# Patient Record
Sex: Female | Born: 1975 | Race: Black or African American | Hispanic: No | Marital: Single | Smoking: Current some day smoker
Health system: Southern US, Community
[De-identification: ages and names within clinical notes are randomized; demographics above are authoritative.]

## PROBLEM LIST (undated history)

## (undated) DIAGNOSIS — N809 Endometriosis, unspecified: Secondary | ICD-10-CM

## (undated) DIAGNOSIS — N3090 Cystitis, unspecified without hematuria: Secondary | ICD-10-CM

## (undated) DIAGNOSIS — E282 Polycystic ovarian syndrome: Secondary | ICD-10-CM

---

## 2020-02-17 ENCOUNTER — Emergency Department
Admission: EM | Admit: 2020-02-17 | Discharge: 2020-02-17 | Disposition: A | Discharge status: Home / Self Care | Payer: Federal, State, Local not specified - PPO | Attending: Emergency Medicine | Admitting: Emergency Medicine

## 2020-02-17 ENCOUNTER — Other Ambulatory Visit: Payer: Self-pay

## 2020-02-17 DIAGNOSIS — F17200 Nicotine dependence, unspecified, uncomplicated: Secondary | ICD-10-CM | POA: Insufficient documentation

## 2020-02-17 DIAGNOSIS — Z20822 Contact with and (suspected) exposure to covid-19: Secondary | ICD-10-CM | POA: Diagnosis not present

## 2020-02-17 DIAGNOSIS — R059 Cough, unspecified: Secondary | ICD-10-CM | POA: Diagnosis present

## 2020-02-17 DIAGNOSIS — B349 Viral infection, unspecified: Secondary | ICD-10-CM | POA: Diagnosis not present

## 2020-02-17 NOTE — ED Triage Notes (Signed)
Pt to ed via pov from home, pt recently taking penicillin for a tooth extraction,  Pt states her throat has began to hurt and is having chest heaviness and cough.

## 2020-02-17 NOTE — ED Provider Notes (Signed)
[facility] EMERGENCY DEPARTMENT Provider Note   CSN: [id number] Arrival date & time: 02/17/20  2031     History No chief complaint on file.   Marissa Rocha is a 45 y.o. female presents to the emerge department evaluation of cough, body aches, chills, sore throat.  She said some body aches but no fevers.  She describes similar symptoms a couple of weeks ago but resolved, her most recent episodes of symptoms began 3 days ago.  She has had a known Covid exposure.  Several other family members in the household have the same symptoms.  She describes a dry cough.  No chest pain or shortness of breath.  She is taking over-the-counter cough and cold medication.  No abdominal pain nausea vomiting or diarrhea.  HPI     History reviewed. No pertinent past medical history.  There are no problems to display for this patient.   History reviewed. No pertinent surgical history.   OB History   No obstetric history on file.     No family history on file.  Social History   Tobacco Use  . Smoking status: Current Every Day Smoker    Packs/day: 0.50  . Smokeless tobacco: Never Used  Substance Use Topics  . Alcohol use: Yes  . Drug use: Never    Home Medications Prior to Admission medications   Not on File    Allergies    Patient has no allergy information on record.  Review of Systems   Review of Systems  Constitutional: Positive for chills. Negative for fever.  HENT: Positive for congestion, rhinorrhea and sore throat. Negative for ear discharge, sinus pressure, sinus pain, trouble swallowing and voice change.   Respiratory: Positive for cough. Negative for choking, shortness of breath, wheezing and stridor.   Cardiovascular: Negative for chest pain.  Gastrointestinal: Negative for abdominal pain, diarrhea, nausea and vomiting.  Genitourinary: Negative for dysuria, flank pain and pelvic pain.  Musculoskeletal: Positive for myalgias. Negative for back pain.   Skin: Negative for rash.  Neurological: Negative for dizziness and headaches.    Physical Exam Updated Vital Signs BP 124/83   Pulse 91   Temp 98.6 F (37 C) (Oral)   Resp 18   Ht 5\' 8"  (1.727 m)   Wt 106.6 kg   SpO2 97%   BMI 35.73 kg/m   Physical Exam Vitals reviewed.  Constitutional:      Appearance: She is well-developed and well-nourished.  HENT:     Head: Normocephalic and atraumatic.     Right Ear: External ear normal.     Left Ear: External ear normal.     Mouth/Throat:     Mouth: Mucous membranes are dry.     Pharynx: No oropharyngeal exudate or posterior oropharyngeal erythema.  Eyes:     Conjunctiva/sclera: Conjunctivae normal.  Cardiovascular:     Rate and Rhythm: Normal rate.     Pulses: Normal pulses.     Heart sounds: Normal heart sounds.  Pulmonary:     Effort: Pulmonary effort is normal. No respiratory distress.     Breath sounds: Normal breath sounds. No stridor. No wheezing, rhonchi or rales.  Abdominal:     General: Abdomen is flat. Bowel sounds are normal. There is no distension.     Palpations: Abdomen is soft.     Tenderness: There is no abdominal tenderness. There is no guarding.  Musculoskeletal:        General: Normal range of motion.     Cervical  back: Normal range of motion and neck supple.  Lymphadenopathy:     Cervical: Cervical adenopathy present.  Skin:    General: Skin is warm.     Findings: No rash.  Neurological:     General: No focal deficit present.     Mental Status: She is alert and oriented to person, place, and time. Mental status is at baseline.  Psychiatric:        Mood and Affect: Mood and affect normal.        Behavior: Behavior normal.        Thought Content: Thought content normal.     ED Results / Procedures / Treatments   Labs (all labs ordered are listed, but only abnormal results are displayed) Labs Reviewed  SARS CORONAVIRUS 2 (TAT 6-24 HRS)    EKG None  Radiology No results  found.  Procedures Procedures   Medications Ordered in ED Medications - No data to display  ED Course  I have reviewed the triage vital signs and the nursing notes.  Pertinent labs & imaging results that were available during my care of the patient were reviewed by me and considered in my medical decision making (see chart for details).    MDM Rules/Calculators/A&P                          45 year old female with viral symptoms x2 to 3 days.  She is fully vaccinated.  She has had positive exposure to Covid.  There-several other family members in the household with same symptoms.  Her symptoms are mild, vital signs are stable.  Physical exam unremarkable, no wheezing rales or rhonchi.  Patient stable and ready for discharge to home.  Covid test pending.  Continue with symptomatic care and over-the-counter cough and cold medications.  She understands signs symptoms return to the ER for. Final Clinical Impression(s) / ED Diagnoses Final diagnoses:  Viral illness  Cough    Rx / DC Orders ED Discharge Orders    None       Ronnette Juniper 02/17/20 2247    Phineas Semen, MD 02/17/20 2317

## 2020-02-17 NOTE — Discharge Instructions (Addendum)
Please continue with over-the-counter cough and cold medication, Tylenol and ibuprofen.  Make sure you are drinking lots of fluids.  If any fevers above 101, shortness of breath return to the emergency department. 

## 2020-02-18 LAB — SARS CORONAVIRUS 2 (TAT 6-24 HRS): SARS Coronavirus 2: NEGATIVE

## 2020-04-27 ENCOUNTER — Emergency Department
Admission: EM | Admit: 2020-04-27 | Discharge: 2020-04-27 | Disposition: A | Discharge status: Home / Self Care | Payer: Federal, State, Local not specified - PPO | Attending: Student in an Organized Health Care Education/Training Program | Admitting: Student in an Organized Health Care Education/Training Program

## 2020-04-27 ENCOUNTER — Other Ambulatory Visit: Payer: Self-pay

## 2020-04-27 ENCOUNTER — Emergency Department: Payer: Federal, State, Local not specified - PPO

## 2020-04-27 DIAGNOSIS — F17200 Nicotine dependence, unspecified, uncomplicated: Secondary | ICD-10-CM | POA: Insufficient documentation

## 2020-04-27 DIAGNOSIS — B349 Viral infection, unspecified: Secondary | ICD-10-CM

## 2020-04-27 DIAGNOSIS — U071 COVID-19: Secondary | ICD-10-CM | POA: Insufficient documentation

## 2020-04-27 DIAGNOSIS — R059 Cough, unspecified: Secondary | ICD-10-CM | POA: Diagnosis present

## 2020-04-27 LAB — RESP PANEL BY RT-PCR (FLU A&B, COVID) ARPGX2
Influenza A by PCR: NEGATIVE
Influenza B by PCR: NEGATIVE
SARS Coronavirus 2 by RT PCR: POSITIVE — AB

## 2020-04-27 NOTE — ED Triage Notes (Signed)
Pt to ER via POV with complaints of cough and congestion that started on Thursday. Pt reports cough was dry but has become productive, clear/ green tinged in color. Denies fevers at home, reports chills. Denies any known covid contacts.

## 2020-04-27 NOTE — ED Provider Notes (Signed)
[facility] Emergency Department Provider Note  ____________________________________________   Event Date/Time   First MD Initiated Contact with Patient 04/27/20 1734     (approximate)  I have reviewed the triage vital signs and the nursing notes.   HISTORY  Chief Complaint Cough   HPI Marissa Rocha is a 45 y.o. female who presents to the emergency department today for evaluation of cough and nasal congestion that started this past Thursday.  Her 2 children are being seen today to with similar symptoms.  Patient reports that her cough was initially dry but has now become productive.  She does report that when child had a positive home COVID test yesterday.  She reports a very intermittent shortness of breath, occasionally when she lays down.  Is not every time.  She denies any history of asthma or other cardiopulmonary disease.  She denies any fever but has had occasional episodes of chills.  He denies any chest pain, headache, nausea vomiting diarrhea or abdominal pain.       History reviewed. No pertinent past medical history.  There are no problems to display for this patient.   History reviewed. No pertinent surgical history.  Prior to Admission medications   Not on File    Allergies Patient has no allergy information on record.  History reviewed. No pertinent family history.  Social History Social History   Tobacco Use  . Smoking status: Current Every Day Smoker    Packs/day: 0.25  . Smokeless tobacco: Never Used  Substance Use Topics  . Alcohol use: Yes  . Drug use: Never    Review of Systems Constitutional: No fever/chills Eyes: No visual changes. ENT: No sore throat. Cardiovascular: Denies chest pain. Respiratory: +cough, +intermittent SOB Gastrointestinal: No abdominal pain.  No nausea, no vomiting.  No diarrhea.  No constipation. Genitourinary: Negative for dysuria. Musculoskeletal: Negative for back pain. Skin: Negative for  rash. Neurological: Negative for headaches, focal weakness or numbness.   ____________________________________________   PHYSICAL EXAM:  VITAL SIGNS: ED Triage Vitals  Enc Vitals Group     BP 04/27/20 1727 (!) 116/101     Pulse Rate 04/27/20 1727 76     Resp 04/27/20 1727 18     Temp 04/27/20 1727 98.5 F (36.9 C)     Temp Source 04/27/20 1727 Oral     SpO2 04/27/20 1727 99 %     Weight --      Height 04/27/20 1734 5\' 8"  (1.727 m)     Head Circumference --      Peak Flow --      Pain Score 04/27/20 1733 5     Pain Loc --      Pain Edu? --      Excl. in GC? --    Constitutional: Alert and oriented. Well appearing and in no acute distress. Eyes: Conjunctivae are normal. PERRL. EOMI. Head: Atraumatic. Nose: No congestion/rhinnorhea. Mouth/Throat: Mucous membranes are moist.  Oropharynx non-erythematous. Neck: No stridor.   Cardiovascular: Normal rate, regular rhythm. Grossly normal heart sounds.  Good peripheral circulation. Respiratory: Normal respiratory effort.  No retractions. Lungs CTAB. Gastrointestinal: Soft and nontender. No distention. No abdominal bruits. No CVA tenderness. Musculoskeletal: No lower extremity tenderness nor edema.  No joint effusions. Neurologic:  Normal speech and language. No gross focal neurologic deficits are appreciated. No gait instability. Skin:  Skin is warm, dry and intact. No rash noted. Psychiatric: Mood and affect are normal. Speech and behavior are normal.  ____________________________________________  LABS (all labs ordered are listed, but only abnormal results are displayed)  Labs Reviewed  RESP PANEL BY RT-PCR (FLU A&B, COVID) ARPGX2 - Abnormal; Notable for the following components:      Result Value   SARS Coronavirus 2 by RT PCR POSITIVE (*)    All other components within normal limits    ____________________________________________  RADIOLOGY I, Lucy Chris, personally viewed and evaluated these images (plain  radiographs) as part of my medical decision making, as well as reviewing the written report by the radiologist.  ED provider interpretation: Chest x-ray is clear  Official radiology report(s): DG Chest 2 View  Result Date: 04/27/2020 CLINICAL DATA:  Cough and shortness of breath EXAM: CHEST - 2 VIEW COMPARISON:  None. FINDINGS: The heart size and mediastinal contours are within normal limits. No focal consolidation. No pleural effusion. No pneumothorax. The visualized skeletal structures are unremarkable. IMPRESSION: No active cardiopulmonary disease. Electronically Signed   By: Maudry Mayhew MD   On: 04/27/2020 18:57   ____________________________________________   INITIAL IMPRESSION / ASSESSMENT AND PLAN / ED COURSE  As part of my medical decision making, I reviewed the following data within the electronic MEDICAL RECORD NUMBER Nursing notes reviewed and incorporated, Radiograph reviewed and Notes from prior ED visits        Patient is a 45 year old female who presents to the emergency department today for evaluation of cough, intermittent shortness of breath that started this past Thursday.  Child in the home tested positive with a home COVID test.  No other known COVID contacts.  In triage, she has reassuring vital signs.  Physical exam is also very reassuring.  Given her complaint of shortness of breath, chest x-ray was obtained and is negative for acute findings.  Recommended supportive care with over-the-counter medications for discomfort and cough.  Return precautions were discussed to return if she has any worsening of her symptoms.  Respiratory panel will be obtained and we will contact the patient of her findings.  Patient is amenable to this plan.  She stable at time for outpatient management.      ____________________________________________   FINAL CLINICAL IMPRESSION(S) / ED DIAGNOSES  Final diagnoses:  Viral illness     ED Discharge Orders    None      *Please note:   Marissa Rocha was evaluated in Emergency Department on 04/28/2020 for the symptoms described in the history of present illness. She was evaluated in the context of the global COVID-19 pandemic, which necessitated consideration that the patient might be at risk for infection with the SARS-CoV-2 virus that causes COVID-19. Institutional protocols and algorithms that pertain to the evaluation of patients at risk for COVID-19 are in a state of rapid change based on information released by regulatory bodies including the CDC and federal and state organizations. These policies and algorithms were followed during the patient's care in the ED.  Some ED evaluations and interventions may be delayed as a result of limited staffing during and the pandemic.*   Note:  This document was prepared using Dragon voice recognition software and may include unintentional dictation errors.   Lucy Chris, PA 04/28/20 Leanord Hawking    Willy Eddy, MD 04/29/20 [phone]

## 2020-04-27 NOTE — Discharge Instructions (Signed)
Please continue to use over the counter supportive measures for your flu-like illness. I will call with the results of your Covid/Flu test. Return to the Emergency Department if you experience any worsening of symptoms, particularly any shortness of breath. Otherwise, follow up with primary care.

## 2020-09-12 ENCOUNTER — Other Ambulatory Visit: Payer: Self-pay

## 2020-09-12 DIAGNOSIS — R079 Chest pain, unspecified: Secondary | ICD-10-CM | POA: Insufficient documentation

## 2020-09-12 DIAGNOSIS — Z5321 Procedure and treatment not carried out due to patient leaving prior to being seen by health care provider: Secondary | ICD-10-CM | POA: Diagnosis not present

## 2020-09-12 DIAGNOSIS — R1030 Lower abdominal pain, unspecified: Secondary | ICD-10-CM | POA: Diagnosis not present

## 2020-09-12 LAB — CBC
HCT: 40.5 % (ref 36.0–46.0)
Hemoglobin: 14.2 g/dL (ref 12.0–15.0)
MCH: 32.6 pg (ref 26.0–34.0)
MCHC: 35.1 g/dL (ref 30.0–36.0)
MCV: 92.9 fL (ref 80.0–100.0)
Platelets: 329 10*3/uL (ref 150–400)
RBC: 4.36 MIL/uL (ref 3.87–5.11)
RDW: 13.3 % (ref 11.5–15.5)
WBC: 14.4 10*3/uL — ABNORMAL HIGH (ref 4.0–10.5)
nRBC: 0 % (ref 0.0–0.2)

## 2020-09-12 LAB — COMPREHENSIVE METABOLIC PANEL
ALT: 23 U/L (ref 0–44)
AST: 36 U/L (ref 15–41)
Albumin: 3.9 g/dL (ref 3.5–5.0)
Alkaline Phosphatase: 91 U/L (ref 38–126)
Anion gap: 8 (ref 5–15)
BUN: 14 mg/dL (ref 6–20)
CO2: 26 mmol/L (ref 22–32)
Calcium: 8.8 mg/dL — ABNORMAL LOW (ref 8.9–10.3)
Chloride: 102 mmol/L (ref 98–111)
Creatinine, Ser: 0.71 mg/dL (ref 0.44–1.00)
GFR, Estimated: >60 mL/min (ref >60)
Glucose, Bld: 90 mg/dL (ref 70–99)
Potassium: 3.8 mmol/L (ref 3.5–5.1)
Sodium: 136 mmol/L (ref 135–145)
Total Bilirubin: 0.6 mg/dL (ref 0.3–1.2)
Total Protein: 7.2 g/dL (ref 6.5–8.1)

## 2020-09-12 LAB — TROPONIN I (HIGH SENSITIVITY): Troponin I (High Sensitivity): 5 ng/L (ref <18)

## 2020-09-12 NOTE — ED Triage Notes (Signed)
Pt in with co lower abd pain that started 2 weeks ago pt has hx of endometriosis. Does have an apptm with pmd but states came for pain control. Pt states she also started having chest pain today with radiation to left arm. Denies any cardiac issues.

## 2020-09-13 ENCOUNTER — Emergency Department
Admission: EM | Admit: 2020-09-13 | Discharge: 2020-09-13 | Disposition: A | Discharge status: Home / Self Care | Payer: Federal, State, Local not specified - PPO | Attending: Emergency Medicine | Admitting: Emergency Medicine

## 2020-09-17 ENCOUNTER — Emergency Department
Admission: EM | Admit: 2020-09-17 | Discharge: 2020-09-17 | Disposition: A | Discharge status: Home / Self Care | Payer: Federal, State, Local not specified - PPO | Attending: Emergency Medicine | Admitting: Emergency Medicine

## 2020-09-17 ENCOUNTER — Other Ambulatory Visit: Payer: Self-pay

## 2020-09-17 DIAGNOSIS — X58XXXA Exposure to other specified factors, initial encounter: Secondary | ICD-10-CM | POA: Insufficient documentation

## 2020-09-17 DIAGNOSIS — Z5321 Procedure and treatment not carried out due to patient leaving prior to being seen by health care provider: Secondary | ICD-10-CM | POA: Insufficient documentation

## 2020-09-17 DIAGNOSIS — S6992XA Unspecified injury of left wrist, hand and finger(s), initial encounter: Secondary | ICD-10-CM | POA: Diagnosis present

## 2020-09-17 DIAGNOSIS — S61012A Laceration without foreign body of left thumb without damage to nail, initial encounter: Secondary | ICD-10-CM | POA: Insufficient documentation
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
## 2020-10-30 ENCOUNTER — Other Ambulatory Visit: Payer: Self-pay

## 2020-10-30 ENCOUNTER — Encounter: Payer: Self-pay | Admitting: Emergency Medicine

## 2020-10-30 ENCOUNTER — Emergency Department: Payer: Federal, State, Local not specified - PPO

## 2020-10-30 ENCOUNTER — Emergency Department
Admission: EM | Admit: 2020-10-30 | Discharge: 2020-10-30 | Disposition: A | Discharge status: Home / Self Care | Payer: Federal, State, Local not specified - PPO | Attending: Emergency Medicine | Admitting: Emergency Medicine

## 2020-10-30 DIAGNOSIS — F17210 Nicotine dependence, cigarettes, uncomplicated: Secondary | ICD-10-CM | POA: Insufficient documentation

## 2020-10-30 DIAGNOSIS — R410 Disorientation, unspecified: Secondary | ICD-10-CM | POA: Diagnosis not present

## 2020-10-30 DIAGNOSIS — R42 Dizziness and giddiness: Secondary | ICD-10-CM | POA: Insufficient documentation

## 2020-10-30 DIAGNOSIS — Y92410 Unspecified street and highway as the place of occurrence of the external cause: Secondary | ICD-10-CM | POA: Insufficient documentation

## 2020-10-30 DIAGNOSIS — S060X0A Concussion without loss of consciousness, initial encounter: Secondary | ICD-10-CM | POA: Diagnosis not present

## 2020-10-30 DIAGNOSIS — S0990XA Unspecified injury of head, initial encounter: Secondary | ICD-10-CM | POA: Diagnosis present

## 2020-10-30 DIAGNOSIS — K115 Sialolithiasis: Secondary | ICD-10-CM | POA: Insufficient documentation

## 2020-10-30 LAB — COMPREHENSIVE METABOLIC PANEL
ALT: 30 U/L (ref 0–44)
AST: 62 U/L — ABNORMAL HIGH (ref 15–41)
Albumin: 3.8 g/dL (ref 3.5–5.0)
Alkaline Phosphatase: 94 U/L (ref 38–126)
Anion gap: 6 (ref 5–15)
BUN: 13 mg/dL (ref 6–20)
CO2: 27 mmol/L (ref 22–32)
Calcium: 8.8 mg/dL — ABNORMAL LOW (ref 8.9–10.3)
Chloride: 104 mmol/L (ref 98–111)
Creatinine, Ser: 0.77 mg/dL (ref 0.44–1.00)
GFR, Estimated: >60 mL/min (ref >60)
Glucose, Bld: 91 mg/dL (ref 70–99)
Potassium: 3.6 mmol/L (ref 3.5–5.1)
Sodium: 137 mmol/L (ref 135–145)
Total Bilirubin: 0.8 mg/dL (ref 0.3–1.2)
Total Protein: 7.4 g/dL (ref 6.5–8.1)

## 2020-10-30 LAB — CBC WITH DIFFERENTIAL/PLATELET
Abs Immature Granulocytes: 0.04 10*3/uL (ref 0.00–0.07)
Basophils Absolute: 0.1 10*3/uL (ref 0.0–0.1)
Basophils Relative: 1 %
Eosinophils Absolute: 0.1 10*3/uL (ref 0.0–0.5)
Eosinophils Relative: 1 %
HCT: 40.5 % (ref 36.0–46.0)
Hemoglobin: 14.1 g/dL (ref 12.0–15.0)
Immature Granulocytes: 0 %
Lymphocytes Relative: 24 %
Lymphs Abs: 2.5 10*3/uL (ref 0.7–4.0)
MCH: 32.3 pg (ref 26.0–34.0)
MCHC: 34.8 g/dL (ref 30.0–36.0)
MCV: 92.9 fL (ref 80.0–100.0)
Monocytes Absolute: 0.8 10*3/uL (ref 0.1–1.0)
Monocytes Relative: 8 %
Neutro Abs: 6.6 10*3/uL (ref 1.7–7.7)
Neutrophils Relative %: 66 %
Platelets: 268 10*3/uL (ref 150–400)
RBC: 4.36 MIL/uL (ref 3.87–5.11)
RDW: 13 % (ref 11.5–15.5)
WBC: 10.1 10*3/uL (ref 4.0–10.5)
nRBC: 0 % (ref 0.0–0.2)

## 2020-10-30 MED ORDER — IOHEXOL 300 MG/ML  SOLN
75.0000 mL | Freq: Once | INTRAMUSCULAR | Status: AC | PRN
  Administered 2020-10-30: 75 mL via INTRAVENOUS
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
## 2021-03-27 ENCOUNTER — Other Ambulatory Visit: Payer: Self-pay

## 2021-03-27 DIAGNOSIS — Z5321 Procedure and treatment not carried out due to patient leaving prior to being seen by health care provider: Secondary | ICD-10-CM | POA: Insufficient documentation

## 2021-03-27 DIAGNOSIS — R1030 Lower abdominal pain, unspecified: Secondary | ICD-10-CM | POA: Insufficient documentation

## 2021-03-27 LAB — CBC
HCT: 43.8 % (ref 36.0–46.0)
Hemoglobin: 14.4 g/dL (ref 12.0–15.0)
MCH: 31.2 pg (ref 26.0–34.0)
MCHC: 32.9 g/dL (ref 30.0–36.0)
MCV: 95 fL (ref 80.0–100.0)
Platelets: 336 10*3/uL (ref 150–400)
RBC: 4.61 MIL/uL (ref 3.87–5.11)
RDW: 13.3 % (ref 11.5–15.5)
WBC: 15.9 10*3/uL — ABNORMAL HIGH (ref 4.0–10.5)
nRBC: 0 % (ref 0.0–0.2)

## 2021-03-27 LAB — POC URINE PREG, ED: Preg Test, Ur: NEGATIVE

## 2021-03-27 NOTE — ED Triage Notes (Signed)
Pt presents to ER with c/o lower abd pain that started Thursday but has become worse today.  Pt states she has hx of endometriosis.  Denies vaginal bleeding at this time.  Denies n/v but states she had some diarrhea recently.  Pt is A&O x4 at this time in NAD in triage.   ?

## 2021-03-28 ENCOUNTER — Emergency Department
Admission: EM | Admit: 2021-03-28 | Discharge: 2021-03-28 | Disposition: A | Discharge status: Home / Self Care | Payer: Federal, State, Local not specified - PPO | Attending: Emergency Medicine | Admitting: Emergency Medicine

## 2021-03-28 LAB — COMPREHENSIVE METABOLIC PANEL
ALT: 21 U/L (ref 0–44)
AST: 19 U/L (ref 15–41)
Albumin: 4 g/dL (ref 3.5–5.0)
Alkaline Phosphatase: 84 U/L (ref 38–126)
Anion gap: 5 (ref 5–15)
BUN: 12 mg/dL (ref 6–20)
CO2: 29 mmol/L (ref 22–32)
Calcium: 9 mg/dL (ref 8.9–10.3)
Chloride: 104 mmol/L (ref 98–111)
Creatinine, Ser: 0.74 mg/dL (ref 0.44–1.00)
GFR, Estimated: >60 mL/min (ref >60)
Glucose, Bld: 109 mg/dL — ABNORMAL HIGH (ref 70–99)
Potassium: 3.6 mmol/L (ref 3.5–5.1)
Sodium: 138 mmol/L (ref 135–145)
Total Bilirubin: 0.3 mg/dL (ref 0.3–1.2)
Total Protein: 7.3 g/dL (ref 6.5–8.1)

## 2021-03-28 LAB — URINALYSIS, ROUTINE W REFLEX MICROSCOPIC
Bilirubin Urine: NEGATIVE
Glucose, UA: NEGATIVE mg/dL
Ketones, ur: NEGATIVE mg/dL
Leukocytes,Ua: NEGATIVE
Nitrite: NEGATIVE
Protein, ur: NEGATIVE mg/dL
Specific Gravity, Urine: 1.025 (ref 1.005–1.030)
pH: 5 (ref 5.0–8.0)

## 2021-03-28 LAB — LIPASE, BLOOD: Lipase: 27 U/L (ref 11–51)

## 2021-03-28 NOTE — ED Notes (Signed)
No answer when called several times from lobby 

## 2021-05-30 ENCOUNTER — Emergency Department: Payer: Federal, State, Local not specified - PPO

## 2021-05-30 ENCOUNTER — Encounter: Payer: Self-pay | Admitting: *Deleted

## 2021-05-30 ENCOUNTER — Other Ambulatory Visit: Payer: Self-pay

## 2021-05-30 DIAGNOSIS — R110 Nausea: Secondary | ICD-10-CM | POA: Diagnosis not present

## 2021-05-30 DIAGNOSIS — Z8742 Personal history of other diseases of the female genital tract: Secondary | ICD-10-CM | POA: Insufficient documentation

## 2021-05-30 DIAGNOSIS — R1031 Right lower quadrant pain: Secondary | ICD-10-CM | POA: Insufficient documentation

## 2021-05-30 DIAGNOSIS — R1032 Left lower quadrant pain: Secondary | ICD-10-CM | POA: Diagnosis not present

## 2021-05-30 DIAGNOSIS — R300 Dysuria: Secondary | ICD-10-CM | POA: Diagnosis not present

## 2021-05-30 LAB — BASIC METABOLIC PANEL
Anion gap: 7 (ref 5–15)
BUN: 13 mg/dL (ref 6–20)
CO2: 25 mmol/L (ref 22–32)
Calcium: 8.8 mg/dL — ABNORMAL LOW (ref 8.9–10.3)
Chloride: 104 mmol/L (ref 98–111)
Creatinine, Ser: 0.74 mg/dL (ref 0.44–1.00)
GFR, Estimated: >60 mL/min (ref >60)
Glucose, Bld: 120 mg/dL — ABNORMAL HIGH (ref 70–99)
Potassium: 3.4 mmol/L — ABNORMAL LOW (ref 3.5–5.1)
Sodium: 136 mmol/L (ref 135–145)

## 2021-05-30 LAB — CBC
HCT: 40.7 % (ref 36.0–46.0)
Hemoglobin: 13.7 g/dL (ref 12.0–15.0)
MCH: 31.8 pg (ref 26.0–34.0)
MCHC: 33.7 g/dL (ref 30.0–36.0)
MCV: 94.4 fL (ref 80.0–100.0)
Platelets: 284 10*3/uL (ref 150–400)
RBC: 4.31 MIL/uL (ref 3.87–5.11)
RDW: 13.2 % (ref 11.5–15.5)
WBC: 15 10*3/uL — ABNORMAL HIGH (ref 4.0–10.5)
nRBC: 0 % (ref 0.0–0.2)

## 2021-05-30 LAB — POC URINE PREG, ED: Preg Test, Ur: NEGATIVE

## 2021-05-30 LAB — TROPONIN I (HIGH SENSITIVITY): Troponin I (High Sensitivity): 3 ng/L (ref <18)

## 2021-05-30 NOTE — ED Provider Triage Note (Signed)
Emergency Medicine Provider Triage Evaluation Note  Marissa Rocha , a 46 y.o. female  was evaluated in triage.  Pt complains of lower abdominal pain with pain when bladder gets full x 1 week. Also having intermittent chest pain that started earlier today.  Review of Systems  Positive: Abdominal pain and chest pain Negative: Fever  Physical Exam  There were no vitals taken for this visit. Gen:   Awake, no distress   Resp:  Normal effort  MSK:   Moves extremities without difficulty  Other:    Medical Decision Making  Medically screening exam initiated at 9:18 PM.  Appropriate orders placed.  [name] was informed that the remainder of the evaluation will be completed by another provider, this initial triage assessment does not replace that evaluation, and the importance of remaining in the ED until their evaluation is complete.   Chinita Pester, FNP 05/30/21 2123

## 2021-05-30 NOTE — ED Triage Notes (Signed)
Pt reports low abd pain and chest pain.  Abd pain for 1 week. Pt has nausea.  No vag bleeding.  No urinary sx.  Pt states chest pain began today.  Pain when lying on left side.  Pt alert  speech clear.

## 2021-05-31 ENCOUNTER — Emergency Department: Payer: Federal, State, Local not specified - PPO

## 2021-05-31 ENCOUNTER — Emergency Department
Admission: EM | Admit: 2021-05-31 | Discharge: 2021-05-31 | Disposition: A | Discharge status: Home / Self Care | Payer: Federal, State, Local not specified - PPO | Attending: Emergency Medicine | Admitting: Emergency Medicine

## 2021-05-31 DIAGNOSIS — R1030 Lower abdominal pain, unspecified: Secondary | ICD-10-CM

## 2021-05-31 DIAGNOSIS — Z8742 Personal history of other diseases of the female genital tract: Secondary | ICD-10-CM

## 2021-05-31 LAB — HEPATIC FUNCTION PANEL
ALT: 14 U/L (ref 0–44)
AST: 18 U/L (ref 15–41)
Albumin: 3.6 g/dL (ref 3.5–5.0)
Alkaline Phosphatase: 75 U/L (ref 38–126)
Bilirubin, Direct: <0.1 mg/dL (ref 0.0–0.2)
Total Bilirubin: 0.3 mg/dL (ref 0.3–1.2)
Total Protein: 7.1 g/dL (ref 6.5–8.1)

## 2021-05-31 LAB — URINALYSIS, ROUTINE W REFLEX MICROSCOPIC
Bilirubin Urine: NEGATIVE
Glucose, UA: NEGATIVE mg/dL
Ketones, ur: NEGATIVE mg/dL
Leukocytes,Ua: NEGATIVE
Nitrite: NEGATIVE
Protein, ur: NEGATIVE mg/dL
Specific Gravity, Urine: 1.023 (ref 1.005–1.030)
pH: 5 (ref 5.0–8.0)

## 2021-05-31 LAB — LIPASE, BLOOD: Lipase: 30 U/L (ref 11–51)

## 2021-05-31 LAB — TROPONIN I (HIGH SENSITIVITY): Troponin I (High Sensitivity): 2 ng/L (ref <18)

## 2021-05-31 MED ORDER — MORPHINE SULFATE (PF) 4 MG/ML IV SOLN
4.0000 mg | Freq: Once | INTRAVENOUS | Status: AC
  Administered 2021-05-31: 4 mg via INTRAVENOUS
  Filled 2021-05-31: qty 1

## 2021-05-31 MED ORDER — LACTATED RINGERS IV BOLUS
1000.0000 mL | Freq: Once | INTRAVENOUS | Status: AC
  Administered 2021-05-31: 1000 mL via INTRAVENOUS

## 2021-05-31 MED ORDER — IOHEXOL 300 MG/ML  SOLN
100.0000 mL | Freq: Once | INTRAMUSCULAR | Status: AC | PRN
  Administered 2021-05-31: 100 mL via INTRAVENOUS

## 2021-05-31 MED ORDER — ONDANSETRON HCL 4 MG/2ML IJ SOLN
4.0000 mg | Freq: Once | INTRAMUSCULAR | Status: AC
  Administered 2021-05-31: 4 mg via INTRAVENOUS
  Filled 2021-05-31: qty 2

## 2021-05-31 NOTE — ED Provider Notes (Signed)
[facility] Provider Note    Event Date/Time   First MD Initiated Contact with Patient 05/31/21 0246     (approximate)   History   Chief Complaint Abdominal Pain   HPI  Marissa Rocha is a 46 y.o. female with past medical history of PCOS and endometriosis who presents to the ED complaining of abdominal pain.  Patient reports that she has been dealing with 1 week of gradually worsening pain to the bilateral lower quadrants of her abdomen.  She describes the pain as sharp and constant, not exacerbated or alleviated by anything in particular.  It radiates around to both flanks and she endorses some dysuria, denies hematuria.  She denies any history of kidney stones or similar pain in the past.  Her LMP was approximately 2 months ago, but states her menses are usually irregular due to Mirena.  She has not had any vaginal bleeding or discharge.  She reports feeling nauseous but has not vomited, denies any changes in her bowel movements.  She does report having an episode of pain in the center of her chest just prior to arrival, but has now resolved.     Physical Exam   Triage Vital Signs: ED Triage Vitals  Enc Vitals Group     BP 05/30/21 2121 (!) 159/97     Pulse Rate 05/30/21 2121 90     Resp 05/30/21 2121 18     Temp 05/30/21 2121 98.4 F (36.9 C)     Temp Source 05/30/21 2121 Oral     SpO2 05/30/21 2121 95 %     Weight 05/30/21 2122 215 lb (97.5 kg)     Height 05/30/21 2122 5\' 8"  (1.727 m)     Head Circumference --      Peak Flow --      Pain Score 05/30/21 2122 9     Pain Loc --      Pain Edu? --      Excl. in GC? --     Most recent vital signs: Vitals:   05/31/21 0500 05/31/21 0530  BP: 131/65 115/83  Pulse: 64 (!) 55  Resp: 15 17  Temp:    SpO2: 97% 99%    Constitutional: Alert and oriented. Eyes: Conjunctivae are normal. Head: Atraumatic. Nose: No congestion/rhinnorhea. Mouth/Throat: Mucous membranes are moist.  Cardiovascular: Normal  rate, regular rhythm. Grossly normal heart sounds.  2+ radial pulses bilaterally. Respiratory: Normal respiratory effort.  No retractions. Lungs CTAB. Gastrointestinal: Soft and tender to palpation in the bilateral lower quadrants with no rebound or guarding. No distention. Musculoskeletal: No lower extremity tenderness nor edema.  Neurologic:  Normal speech and language. No gross focal neurologic deficits are appreciated.    ED Results / Procedures / Treatments   Labs (all labs ordered are listed, but only abnormal results are displayed) Labs Reviewed  BASIC METABOLIC PANEL - Abnormal; Notable for the following components:      Result Value   Potassium 3.4 (*)    Glucose, Bld 120 (*)    Calcium 8.8 (*)    All other components within normal limits  CBC - Abnormal; Notable for the following components:   WBC 15.0 (*)    All other components within normal limits  URINALYSIS, ROUTINE W REFLEX MICROSCOPIC - Abnormal; Notable for the following components:   Color, Urine YELLOW (*)    APPearance CLEAR (*)    Hgb urine dipstick MODERATE (*)    Bacteria, UA RARE (*)  All other components within normal limits  HEPATIC FUNCTION PANEL  LIPASE, BLOOD  POC URINE PREG, ED  TROPONIN I (HIGH SENSITIVITY)  TROPONIN I (HIGH SENSITIVITY)     EKG  ED ECG REPORT I, Chesley Noon, the attending physician, personally viewed and interpreted this ECG.   Date: 05/31/2021  EKG Time: 21:24  Rate: 84  Rhythm: normal sinus rhythm  Axis: Normal  Intervals:none  ST&T Change: None  RADIOLOGY Chest x-ray reviewed and interpreted by me with no focal infiltrate, edema, or effusion.  PROCEDURES:  Critical Care performed: No  Procedures   MEDICATIONS ORDERED IN ED: Medications  lactated ringers bolus 1,000 mL (1,000 mLs Intravenous New Bag/Given 05/31/21 0431)  morphine (PF) 4 MG/ML injection 4 mg (4 mg Intravenous Given 05/31/21 0431)  ondansetron (ZOFRAN) injection 4 mg (4 mg Intravenous  Given 05/31/21 0418)  iohexol (OMNIPAQUE) 300 MG/ML solution 100 mL (100 mLs Intravenous Contrast Given 05/31/21 0440)     IMPRESSION / MDM / ASSESSMENT AND PLAN / ED COURSE  I reviewed the triage vital signs and the nursing notes.                              46 y.o. female with past medical history of PCOS and endometriosis who presents to the ED complaining of bilateral lower quadrant abdominal pain radiating up to both flanks for about the past week.  Differential diagnosis includes, but is not limited to, kidney stone, pyelonephritis, ovarian cyst, ovarian torsion, ectopic pregnancy, diverticulitis, appendicitis, cystitis.  Patient well-appearing and in no acute distress, vital signs are unremarkable.  She has tenderness to bilateral lower quadrants on exam with bilateral CVA tenderness, we will further assess with CT scan for ureterolithiasis or other source of patient's pain.  Pregnancy testing is negative and urinalysis is pending.  CBC shows no anemia or leukocytosis, BMP shows no electrolyte abnormality or AKI.  She did have brief episode of chest pain just prior to arrival in the ED that has since resolved.  EKG shows no evidence of arrhythmia or ischemia and 2 sets of troponin are negative.  Chest x-ray is unremarkable and I doubt PE or dissection given resolution of pain.  No apparent emergent pathology causing her chest pain at this time.  Plan to treat symptomatically with IV morphine and Zofran, reassess.  CT scan is unremarkable and patient is feeling much better following symptomatic management.  LFTs and lipase are within normal limits.  Patient is appropriate for discharge home with PCP and OB/GYN follow-up for potential endometriosis.  She was counseled to return to the ED for new or worsening symptoms, patient agrees with plan.      FINAL CLINICAL IMPRESSION(S) / ED DIAGNOSES   Final diagnoses:  Lower abdominal pain  History of endometriosis     Rx / DC Orders   ED  Discharge Orders     None        Note:  This document was prepared using Dragon voice recognition software and may include unintentional dictation errors.   Chesley Noon, MD 05/31/21 [phone]

## 2021-05-31 NOTE — ED Notes (Signed)
ED Provider at bedside. 

## 2021-11-29 ENCOUNTER — Encounter: Payer: Self-pay | Admitting: Emergency Medicine

## 2021-11-29 ENCOUNTER — Emergency Department
Admission: EM | Admit: 2021-11-29 | Discharge: 2021-11-30 | Disposition: A | Discharge status: Home / Self Care | Payer: Federal, State, Local not specified - PPO | Attending: Emergency Medicine | Admitting: Emergency Medicine

## 2021-11-29 ENCOUNTER — Emergency Department: Payer: Federal, State, Local not specified - PPO

## 2021-11-29 DIAGNOSIS — J069 Acute upper respiratory infection, unspecified: Secondary | ICD-10-CM | POA: Insufficient documentation

## 2021-11-29 DIAGNOSIS — R0981 Nasal congestion: Secondary | ICD-10-CM | POA: Diagnosis present

## 2021-11-29 DIAGNOSIS — Z20822 Contact with and (suspected) exposure to covid-19: Secondary | ICD-10-CM | POA: Insufficient documentation

## 2021-11-29 LAB — RESP PANEL BY RT-PCR (FLU A&B, COVID) ARPGX2
Influenza A by PCR: NEGATIVE
Influenza B by PCR: NEGATIVE
SARS Coronavirus 2 by RT PCR: NEGATIVE

## 2021-11-29 LAB — GROUP A STREP BY PCR: Group A Strep by PCR: NOT DETECTED

## 2021-11-29 MED ORDER — IBUPROFEN 400 MG PO TABS
400.0000 mg | ORAL_TABLET | Freq: Once | ORAL | Status: AC
  Administered 2021-11-30: 400 mg via ORAL
  Filled 2021-11-29: qty 1

## 2021-11-29 NOTE — ED Triage Notes (Signed)
Pt presents via POV with complaints of nasal congestion with fatigue and cough for the last 5 days. Pt received OTC cough medication without improvements to their sx. Denies CP, N/V/D, SOB.

## 2021-11-29 NOTE — ED Notes (Signed)
Covid and strep swabs sent to the lab at this time.  

## 2021-11-30 NOTE — ED Provider Notes (Signed)
[facility] Provider Note    Event Date/Time   First MD Initiated Contact with Patient 11/29/21 2334     (approximate)   History   Nasal Congestion   HPI  Marissa Rocha is a 46 y.o. female with past medical history of endometriosis presents with nasal congestion fatigue chest pressure.  Symptoms have been going on for the last 4 days.  Started after Thanksgiving.  She has had significant nasal congestion and cough and feels extremely fatigued.  She is also having some pain in the upper back and chest with coughing.  Denies shortness of breath denies fever.  Both of her sons have similar illnesses.  Her son tested positive for COVID in the ED today.     History reviewed. No pertinent past medical history.  There are no problems to display for this patient.    Physical Exam  Triage Vital Signs: ED Triage Vitals  Enc Vitals Group     BP 11/29/21 2211 119/80     Pulse Rate 11/29/21 2211 85     Resp 11/29/21 2211 20     Temp 11/29/21 2211 98.6 F (37 C)     Temp Source 11/29/21 2211 Oral     SpO2 11/29/21 2211 98 %     Weight 11/29/21 2206 226 lb 3.1 oz (102.6 kg)     Height 11/29/21 2206 5\' 8"  (1.727 m)     Head Circumference --      Peak Flow --      Pain Score 11/29/21 2212 6     Pain Loc --      Pain Edu? --      Excl. in GC? --     Most recent vital signs: Vitals:   11/29/21 2211 11/30/21 0041  BP: 119/80 128/81  Pulse: 85 79  Resp: 20 17  Temp: 98.6 F (37 C) 98.7 F (37.1 C)  SpO2: 98% 99%     General: Awake, no distress.  CV:  Good peripheral perfusion.  No peripheral edema or asymmetry Resp:  Normal effort.  Lungs are clear no increased work of breathing Abd:  No distention.  Abdomen soft and nontender Neuro:             Awake, Alert, Oriented x 3  Other:     ED Results / Procedures / Treatments  Labs (all labs ordered are listed, but only abnormal results are displayed) Labs Reviewed  RESP PANEL BY RT-PCR (FLU A&B,  COVID) [id number]  GROUP A STREP BY PCR     EKG EKG reviewed and interpreted myself shows normal sinus rhythm normal axis normal intervals inverted T wave in V3 similar to prior    RADIOLOGY I reviewed and interpreted the CXR which does not show any acute cardiopulmonary process    PROCEDURES:  Critical Care performed: No  Procedures   MEDICATIONS ORDERED IN ED: Medications  ibuprofen (ADVIL) tablet 400 mg (400 mg Oral Given 11/30/21 0036)     IMPRESSION / MDM / ASSESSMENT AND PLAN / ED COURSE  I reviewed the triage vital signs and the nursing notes.                              Patient's presentation is most consistent with acute, uncomplicated illness.  Differential diagnosis includes, but is not limited to, viral illness, pneumonia, COVID-19, influenza, likely PE ACS  The patient is a 46 year old female presents with both of  her sons because of cough congestion fatigue and chest/back pain.  Symptoms been going on for the last 4 days she endorses significant fatigue and wanting to sleep constantly and nasal congestion postnasal drip.  She is now having some upper back and chest pain that is worse with coughing and breathing.  Patient looks well she is not in any respiratory distress she is satting 99% on room air not tachycardic.  Lungs are clear.  No signs of DVT on exam.  Her son tested positive for COVID today.  Her test is negative but suspect this could be falsely negative given her symptoms.  Strep test was also sent from triage and this is negative.  Chest x-ray is clear.  Performed EKG given the chest pain she has an inverted T wave in V3 but this is similar in appearance to prior EKGs.  Overall my suspicion for life-threatening cause of her symptoms is low suspect viral syndrome.  Discussed supportive measures she is appropriate for discharge       FINAL CLINICAL IMPRESSION(S) / ED DIAGNOSES   Final diagnoses:  Upper respiratory tract infection, unspecified type      Rx / DC Orders   ED Discharge Orders     None        Note:  This document was prepared using Dragon voice recognition software and may include unintentional dictation errors.   Georga Hacking, MD 11/30/21 [phone]

## 2022-01-07 ENCOUNTER — Other Ambulatory Visit: Payer: Self-pay

## 2022-01-07 ENCOUNTER — Emergency Department: Payer: Federal, State, Local not specified - PPO

## 2022-01-07 ENCOUNTER — Encounter: Payer: Self-pay | Admitting: Radiology

## 2022-01-07 ENCOUNTER — Emergency Department
Admission: EM | Admit: 2022-01-07 | Discharge: 2022-01-08 | Disposition: A | Discharge status: Home / Self Care | Payer: Federal, State, Local not specified - PPO | Attending: Emergency Medicine | Admitting: Emergency Medicine

## 2022-01-07 DIAGNOSIS — M545 Low back pain, unspecified: Secondary | ICD-10-CM | POA: Diagnosis present

## 2022-01-07 DIAGNOSIS — X58XXXA Exposure to other specified factors, initial encounter: Secondary | ICD-10-CM | POA: Diagnosis not present

## 2022-01-07 DIAGNOSIS — S39012A Strain of muscle, fascia and tendon of lower back, initial encounter: Secondary | ICD-10-CM | POA: Insufficient documentation

## 2022-01-07 LAB — URINALYSIS, ROUTINE W REFLEX MICROSCOPIC
Bilirubin Urine: NEGATIVE
Glucose, UA: NEGATIVE mg/dL
Hgb urine dipstick: NEGATIVE
Ketones, ur: NEGATIVE mg/dL
Leukocytes,Ua: NEGATIVE
Nitrite: NEGATIVE
Protein, ur: NEGATIVE mg/dL
Specific Gravity, Urine: 1.029 (ref 1.005–1.030)
pH: 5 (ref 5.0–8.0)

## 2022-01-07 LAB — POC URINE PREG, ED: Preg Test, Ur: NEGATIVE

## 2022-01-07 MED ORDER — LIDOCAINE 5 % EX PTCH
1.0000 | MEDICATED_PATCH | Freq: Once | CUTANEOUS | Status: DC
  Administered 2022-01-08: 1 via TRANSDERMAL
  Filled 2022-01-07: qty 1

## 2022-01-07 NOTE — ED Triage Notes (Signed)
Pt to ED for back pain that started around 0300 this morning. She got up off the couch and had a sudden sharp pain that occurred in her lower back and afterwards. She advised it felt like a pulled muscle. She took some aleve around 330 am and went back to bed with heating pad. Pt took more meds around 2pm. Pt denies any urinary complaints, N/V/D. She also took some prescribed baclofen around 6pm as well that helped some. Pt is COAx4 and in no acute distress at this time.

## 2022-01-07 NOTE — Discharge Instructions (Signed)
Your exam and XR are normal at this time. Take the prescription meds as directed. Follow-up with your provider for ongoing symptoms.

## 2022-01-07 NOTE — ED Provider Notes (Signed)
[facility] Emergency Department Provider Note     Event Date/Time   First MD Initiated Contact with Patient 01/07/22 2205     (approximate)   History   Back Pain   HPI  Marissa Rocha is a 47 y.o. female with a noncontributory medical history, presents to the ED from home, with complaints of low back pain.  She reports onset of symptoms at about 3:00 in the morning.  She reports mechanical strain after she got off the couch and felt immediate sharp pain to her lower back.  She does a muscle relaxant, and a heating pad in the interim before presenting to the ED for further evaluation patient denies any bladder or bowel incontinence, foot drop, saddle anesthesia.  She also denies any urinary symptoms, NVD.  She notes some improvement in her symptomology at the time of this evaluation.  Physical Exam   Triage Vital Signs: ED Triage Vitals  Enc Vitals Group     BP 01/07/22 2125 (!) 142/78     Pulse Rate 01/07/22 2125 88     Resp 01/07/22 2125 16     Temp 01/07/22 2125 97.9 F (36.6 C)     Temp Source 01/07/22 2125 Oral     SpO2 01/07/22 2125 94 %     Weight 01/07/22 2122 235 lb (106.6 kg)     Height 01/07/22 2122 5\' 8"  (1.727 m)     Head Circumference --      Peak Flow --      Pain Score 01/07/22 2121 10     Pain Loc --      Pain Edu? --      Excl. in [place]? --     Most recent vital signs: Vitals:   01/07/22 2125 01/08/22 0100  BP: (!) 142/78 119/72  Pulse: 88 80  Resp: 16 16  Temp: 97.9 F (36.6 C)   SpO2: 94% 100%    General Awake, no distress.  CV:  Good peripheral perfusion.  RESP:  Normal effort.  ABD:  No distention.  MSK:  Normal spinal alignment without midline tenderness, spasm, vomiting, or step-off.  Palpation to the right lumbosacral junction. NEURO: Cranial nerves II to XII grossly intact.  Normal DTRs bilaterally.  Some ipsilateral right-sided lumbar sacral strain with seated straight leg raise on the left.  Normal toe  dorsiflexion and foot eversion on exam.   ED Results / Procedures / Treatments   Labs (all labs ordered are listed, but only abnormal results are displayed) Labs Reviewed  URINALYSIS, ROUTINE W REFLEX MICROSCOPIC - Abnormal; Notable for the following components:      Result Value   Color, Urine YELLOW (*)    APPearance HAZY (*)    All other components within normal limits  POC URINE PREG, ED     EKG    RADIOLOGY   DG Lumbar Spine  IMPRESSION: Negative.     PROCEDURES:  Critical Care performed: No  Procedures   MEDICATIONS ORDERED IN ED: Medications  HYDROcodone-acetaminophen (NORCO/VICODIN) 5-325 MG per tablet 1 tablet (1 tablet Oral Given 01/08/22 0101)     IMPRESSION / MDM / ASSESSMENT AND PLAN / ED COURSE  I reviewed the triage vital signs and the nursing notes.                              Differential diagnosis includes, but is not limited to, lumbar strain, lumbar radiculopathy, lumbar  compression fracture, DJD  Patient's presentation is most consistent with acute complicated illness / injury requiring diagnostic workup.  Patient to the ED for evaluation of lumbar sacral strain.  No red flags on exam.  Patient with no acute neuromuscular deficits appreciated.  Further evaluation with plain films do not reveal any acute findings based on my interpretation.  Patient's diagnosis is consistent with lumbar sacral strain. Patient will be discharged home with prescriptions for lidocaine patches and prednisone. Patient is to follow up with her primary provider as needed or otherwise directed. Patient is given ED precautions to return to the ED for any worsening or new symptoms.   FINAL CLINICAL IMPRESSION(S) / ED DIAGNOSES   Final diagnoses:  Strain of lumbar region, initial encounter     Rx / DC Orders   ED Discharge Orders          Ordered    lidocaine (LIDODERM) 5 %  Every 12 hours PRN        01/08/22 0004    predniSONE (DELTASONE) 20 MG tablet   Daily with breakfast        01/08/22 0004    HYDROcodone-acetaminophen (NORCO) 5-325 MG tablet  3 times daily PRN,   Status:  Discontinued        01/08/22 0004             Note:  This document was prepared using Dragon voice recognition software and may include unintentional dictation errors.    Lissa Hoard, PA-C 01/09/22 1701    Jene Every, MD 01/10/22 1051

## 2022-01-08 ENCOUNTER — Telehealth: Payer: Self-pay | Admitting: Emergency Medicine

## 2022-01-08 MED ORDER — HYDROCODONE-ACETAMINOPHEN 5-325 MG PO TABS: 1.0000 | ORAL_TABLET | Freq: Three times a day (TID) | ORAL | 0 refills | Status: DC | PRN

## 2022-01-08 MED ORDER — PREDNISONE 20 MG PO TABS: 40.0000 mg | ORAL_TABLET | Freq: Every day | ORAL | 0 refills | Status: AC

## 2022-01-08 MED ORDER — HYDROCODONE-ACETAMINOPHEN 5-325 MG PO TABS: 1.0000 | ORAL_TABLET | Freq: Three times a day (TID) | ORAL | 0 refills | Status: AC | PRN

## 2022-01-08 MED ORDER — HYDROCODONE-ACETAMINOPHEN 5-325 MG PO TABS
1.0000 | ORAL_TABLET | Freq: Once | ORAL | Status: AC
  Administered 2022-01-08: 1 via ORAL
  Filled 2022-01-08: qty 1

## 2022-01-08 MED ORDER — LIDOCAINE 5 % EX PTCH: 1.0000 | MEDICATED_PATCH | Freq: Two times a day (BID) | CUTANEOUS | 0 refills | Status: AC | PRN

## 2022-01-08 NOTE — Telephone Encounter (Signed)
Reroute Rx to pharmacy; none in stock at prior.

## 2022-01-24 ENCOUNTER — Other Ambulatory Visit: Payer: Self-pay

## 2022-01-24 ENCOUNTER — Emergency Department
Admission: EM | Admit: 2022-01-24 | Discharge: 2022-01-24 | Disposition: A | Discharge status: Home / Self Care | Payer: Federal, State, Local not specified - PPO | Attending: Emergency Medicine | Admitting: Emergency Medicine

## 2022-01-24 ENCOUNTER — Emergency Department: Payer: Federal, State, Local not specified - PPO

## 2022-01-24 DIAGNOSIS — Z1152 Encounter for screening for COVID-19: Secondary | ICD-10-CM | POA: Insufficient documentation

## 2022-01-24 DIAGNOSIS — J101 Influenza due to other identified influenza virus with other respiratory manifestations: Secondary | ICD-10-CM | POA: Diagnosis not present

## 2022-01-24 DIAGNOSIS — M546 Pain in thoracic spine: Secondary | ICD-10-CM | POA: Insufficient documentation

## 2022-01-24 DIAGNOSIS — R509 Fever, unspecified: Secondary | ICD-10-CM | POA: Diagnosis present

## 2022-01-24 LAB — RESP PANEL BY RT-PCR (RSV, FLU A&B, COVID)  RVPGX2
Influenza A by PCR: NEGATIVE
Influenza B by PCR: NEGATIVE
Resp Syncytial Virus by PCR: NEGATIVE
SARS Coronavirus 2 by RT PCR: NEGATIVE

## 2022-01-24 NOTE — ED Provider Triage Note (Signed)
Emergency Medicine Provider Triage Evaluation Note  Marissa Rocha , a 47 y.o. female  was evaluated in triage.  Pt complains of muscle aches, cough, sore throat. Here with sons who have fever/flu like symptoms.   Review of Systems  Positive: Myalgias, sore throat Negative: Abd pain, n/v/d  Physical Exam  There were no vitals taken for this visit. Gen:   Awake, no distress   Resp:  Normal effort  MSK:   Moves extremities without difficulty  Other:    Medical Decision Making  Medically screening exam initiated at 2:58 PM.  Appropriate orders placed.  Glessie Eustice was informed that the remainder of the evaluation will be completed by another provider, this initial triage assessment does not replace that evaluation, and the importance of remaining in the ED until their evaluation is complete.     Marquette Old, PA-C 01/24/22 1500

## 2022-01-24 NOTE — ED Provider Notes (Signed)
[facility] Provider Note  Patient Contact: 5:08 PM (approximate)   History   Back Pain   HPI  Marissa Rocha is a 47 y.o. female presents to the emergency department with upper back pain and headache.  Patient's sons both tested positive for influenza B recently.  Patient also has nasal congestion and generalized malaise at home.  No chest pain, chest tightness or shortness of breath.  Patient has had some low-grade fever at home.      Physical Exam   Triage Vital Signs: ED Triage Vitals  Enc Vitals Group     BP 01/24/22 1459 (!) 145/88     Pulse Rate 01/24/22 1459 92     Resp 01/24/22 1459 18     Temp 01/24/22 1459 98.6 F (37 C)     Temp src --      SpO2 01/24/22 1459 97 %     Weight --      Height --      Head Circumference --      Peak Flow --      Pain Score 01/24/22 1458 8     Pain Loc --      Pain Edu? --      Excl. in [place]? --     Most recent vital signs: Vitals:   01/24/22 1459  BP: (!) 145/88  Pulse: 92  Resp: 18  Temp: 98.6 F (37 C)  SpO2: 97%     Constitutional: Alert and oriented. Patient is lying supine. Eyes: Conjunctivae are normal. PERRL. EOMI. Head: Atraumatic. ENT:      Ears: Tympanic membranes are mildly injected with mild effusion bilaterally.       Nose: No congestion/rhinnorhea.      Mouth/Throat: Mucous membranes are moist. Posterior pharynx is mildly erythematous.  Hematological/Lymphatic/Immunilogical: No cervical lymphadenopathy.  Cardiovascular: Normal rate, regular rhythm. Normal S1 and S2.  Good peripheral circulation. Respiratory: Normal respiratory effort without tachypnea or retractions. Lungs CTAB. Good air entry to the bases with no decreased or absent breath sounds. Gastrointestinal: Bowel sounds 4 quadrants. Soft and nontender to palpation. No guarding or rigidity. No palpable masses. No distention. No CVA tenderness. Musculoskeletal: Full range of motion to all extremities. No gross deformities  appreciated. Neurologic:  Normal speech and language. No gross focal neurologic deficits are appreciated.  Skin:  Skin is warm, dry and intact. No rash noted. Psychiatric: Mood and affect are normal. Speech and behavior are normal. Patient exhibits appropriate insight and judgement.    ED Results / Procedures / Treatments   Labs (all labs ordered are listed, but only abnormal results are displayed) Labs Reviewed  RESP PANEL BY RT-PCR (RSV, FLU A&B, COVID)  [id number]      PROCEDURES:  Critical Care performed: No  Procedures   MEDICATIONS ORDERED IN ED: Medications - No data to display   IMPRESSION / MDM / [place] / ED COURSE  I reviewed the triage vital signs and the nursing notes.                              Assessment and plan:  Influenza B 47 year old female presents to the emergency department with viral URI-like symptoms and some myalgias.  Patient was mildly hypertensive at triage but vital signs reassuring.  Suspect influenza B infection since both sons tested positive for flu B today.  Supportive medications were encouraged at home as patient is outside the therapeutic  window for Tamiflu.  A work note was provided.  All patient questions were answered.   FINAL CLINICAL IMPRESSION(S) / ED DIAGNOSES   Final diagnoses:  Influenza B     Rx / DC Orders   ED Discharge Orders     None        Note:  This document was prepared using Dragon voice recognition software and may include unintentional dictation errors.   Vallarie Mare [place], Hershal Coria 01/24/22 1711    Lavonia Drafts, MD 01/24/22 Carollee Massed

## 2022-01-24 NOTE — Discharge Instructions (Signed)
You can continue to take naproxen and ibuprofen for headaches and bodyaches. You can continue to take your baclofen at night. Please rest and stay hydrated at home.

## 2022-01-24 NOTE — ED Triage Notes (Signed)
Pt comes with c/o back pain in left shoulder. Pt states headache as well. Pt states this all started few weeks ago.

## 2022-01-28 ENCOUNTER — Emergency Department: Payer: Federal, State, Local not specified - PPO

## 2022-01-28 ENCOUNTER — Other Ambulatory Visit: Payer: Self-pay

## 2022-01-28 ENCOUNTER — Encounter: Payer: Self-pay | Admitting: Emergency Medicine

## 2022-01-28 ENCOUNTER — Emergency Department
Admission: EM | Admit: 2022-01-28 | Discharge: 2022-01-28 | Disposition: A | Discharge status: Home / Self Care | Payer: Federal, State, Local not specified - PPO | Attending: Emergency Medicine | Admitting: Emergency Medicine

## 2022-01-28 DIAGNOSIS — Z1152 Encounter for screening for COVID-19: Secondary | ICD-10-CM | POA: Diagnosis not present

## 2022-01-28 DIAGNOSIS — R6889 Other general symptoms and signs: Secondary | ICD-10-CM

## 2022-01-28 DIAGNOSIS — J209 Acute bronchitis, unspecified: Secondary | ICD-10-CM

## 2022-01-28 DIAGNOSIS — R059 Cough, unspecified: Secondary | ICD-10-CM | POA: Diagnosis present

## 2022-01-28 LAB — RESP PANEL BY RT-PCR (RSV, FLU A&B, COVID)  RVPGX2
Influenza A by PCR: NEGATIVE
Influenza B by PCR: NEGATIVE
Resp Syncytial Virus by PCR: NEGATIVE
SARS Coronavirus 2 by RT PCR: NEGATIVE

## 2022-01-28 MED ORDER — PSEUDOEPH-BROMPHEN-DM 30-2-10 MG/5ML PO SYRP: 5.0000 mL | ORAL_SOLUTION | Freq: Four times a day (QID) | ORAL | 0 refills | Status: DC | PRN

## 2022-01-28 MED ORDER — PREDNISONE 10 MG (21) PO TBPK: ORAL_TABLET | ORAL | 0 refills | Status: DC

## 2022-01-28 NOTE — ED Triage Notes (Signed)
Pt via POV from home. Pt c/o cough, L sided chest pressure when she coughs, and generalized body aches that started yesterday. States that her children were seen here on Wednesday and were positive for the flu. Pt is A&Ox4 and NAD

## 2022-01-28 NOTE — Discharge Instructions (Signed)
Follow-up with your regular doctor if not improving 3 days.  Return emergency department worsening.  Use medications as prescribed.

## 2022-01-28 NOTE — ED Provider Notes (Signed)
[facility] Provider Note    Event Date/Time   First MD Initiated Contact with Patient 01/28/22 1025     (approximate)   History   Cough and Generalized Body Aches   HPI  Marissa Rocha is a 47 y.o. female with no significant past medical history presents emergency department with cough, chest pressure on the left side and bodyaches that started on Tuesday.  Patient states her children were diagnosed with influenza on Wednesday.  States she began feeling the chest pressure and shortness of breath yesterday.  States it is more with the cough.  No fever or chills.      Physical Exam   Triage Vital Signs: ED Triage Vitals  Enc Vitals Group     BP 01/28/22 0910 124/72     Pulse Rate 01/28/22 0910 75     Resp 01/28/22 0910 18     Temp 01/28/22 0910 98.7 F (37.1 C)     Temp Source 01/28/22 0910 Oral     SpO2 01/28/22 0910 99 %     Weight 01/28/22 0903 225 lb (102.1 kg)     Height 01/28/22 0903 5\' 8"  (1.727 m)     Head Circumference --      Peak Flow --      Pain Score 01/28/22 0902 8     Pain Loc --      Pain Edu? --      Excl. in [place]? --     Most recent vital signs: Vitals:   01/28/22 0910  BP: 124/72  Pulse: 75  Resp: 18  Temp: 98.7 F (37.1 C)  SpO2: 99%     General: Awake, no distress.   CV:  Good peripheral perfusion. regular rate and  rhythm Resp:  Normal effort. Lungs cta Abd:  No distention.   Other:      ED Results / Procedures / Treatments   Labs (all labs ordered are listed, but only abnormal results are displayed) Labs Reviewed  RESP PANEL BY RT-PCR (RSV, FLU A&B, COVID)  [id number]     EKG  EKG   RADIOLOGY Chest x-ray    PROCEDURES:   Procedures   MEDICATIONS ORDERED IN ED: Medications - No data to display   IMPRESSION / MDM / [place] / ED COURSE  I reviewed the triage vital signs and the nursing notes.                              Differential diagnosis includes, but is not limited  to, CAP, influenza, COVID, RSV, MI  Patient's presentation is most consistent with acute complicated illness / injury requiring diagnostic workup.   The patient has a lot of cough and congestion and the pain is mostly with cough, feel MI less likely.  EKG shows normal sinus but marked arrhythmia which was noted on a previous EKG.  No change when compared to old EKG  Respiratory panel is reassuring  Chest x-ray independently reviewed and interpreted by me as being negative for any acute abnormality  Did discuss findings with patient.  She was given a steroid pack and Bromfed cough syrup.  She is to follow-up with her regular doctor if not improving 3 days.  Return if worsening.  Do not feel patient needs further workup as she does appear to be well and her vitals are normal.  EKG had no changes so do not feel this is cardiac related.  She is in agreement treatment plan.  Discharged stable condition.        FINAL CLINICAL IMPRESSION(S) / ED DIAGNOSES   Final diagnoses:  Flu-like symptoms  Acute bronchitis, unspecified organism     Rx / DC Orders   ED Discharge Orders          Ordered    brompheniramine-pseudoephedrine-DM 30-2-10 MG/5ML syrup  4 times daily PRN        01/28/22 1133    predniSONE (STERAPRED UNI-PAK 21 TAB) 10 MG (21) TBPK tablet        01/28/22 1133             Note:  This document was prepared using Dragon voice recognition software and may include unintentional dictation errors.    Versie Starks, PA-C 01/28/22 1138    Blake Divine, MD 01/28/22 1450

## 2022-02-04 ENCOUNTER — Emergency Department
Admission: EM | Admit: 2022-02-04 | Discharge: 2022-02-04 | Disposition: A | Discharge status: Home / Self Care | Payer: Federal, State, Local not specified - PPO | Attending: Emergency Medicine | Admitting: Emergency Medicine

## 2022-02-04 DIAGNOSIS — F17210 Nicotine dependence, cigarettes, uncomplicated: Secondary | ICD-10-CM | POA: Insufficient documentation

## 2022-02-04 DIAGNOSIS — R519 Headache, unspecified: Secondary | ICD-10-CM | POA: Diagnosis present

## 2022-02-04 DIAGNOSIS — J329 Chronic sinusitis, unspecified: Secondary | ICD-10-CM | POA: Insufficient documentation

## 2022-02-04 MED ORDER — FLUCONAZOLE 150 MG PO TABS: 150.0000 mg | ORAL_TABLET | Freq: Once | ORAL | 0 refills | Status: AC

## 2022-02-04 MED ORDER — AMOXICILLIN-POT CLAVULANATE 875-125 MG PO TABS
1.0000 | ORAL_TABLET | Freq: Once | ORAL | Status: AC
  Administered 2022-02-04: 1 via ORAL
  Filled 2022-02-04: qty 1

## 2022-02-04 MED ORDER — AMOXICILLIN-POT CLAVULANATE 875-125 MG PO TABS: 1.0000 | ORAL_TABLET | Freq: Two times a day (BID) | ORAL | 0 refills | Status: AC

## 2022-02-04 NOTE — ED Provider Notes (Signed)
[facility] Emergency Department Provider Note   ____________________________________________   Event Date/Time   First MD Initiated Contact with Patient 02/04/22 2024     (approximate)  I have reviewed the triage vital signs and the nursing notes.   HISTORY  Chief Complaint Epistaxis    HPI Marissa Rocha is a 47 y.o. female presents to the emergency room with complaint of headache/facial pressure and pain.  She denies having epistaxis.  She states that she does have a sensation that feels like epistaxis but has never had a nosebleed as of yet.  Patient reports that she had the flu approximately a week and a half ago and since that time she has continued with headache/pressure in her face.  She denies any cough/fever/runny nose/nausea/vomiting/diarrhea. She has not taken anything for her symptoms at this time. Patient reports that she thought that her blood pressure was causing her to have headaches.  However, she has never been diagnosed with hypertension.  Her blood pressure in the ED today is 136/84 and I do not feel that this is causing her symptoms.   No past medical history on file.  There are no problems to display for this patient.   Past Surgical History:  Procedure Laterality Date   CESAREAN SECTION     x3    Prior to Admission medications   Medication Sig Start Date End Date Taking? Authorizing Provider  amoxicillin-clavulanate (AUGMENTIN) 875-125 MG tablet Take 1 tablet by mouth 2 (two) times daily for 10 days. 02/04/22 02/14/22 Yes Willaim Rayas, NP  brompheniramine-pseudoephedrine-DM 30-2-10 MG/5ML syrup Take 5 mLs by mouth 4 (four) times daily as needed. 01/28/22   Fisher, Linden Dolin, PA-C  predniSONE (STERAPRED UNI-PAK 21 TAB) 10 MG (21) TBPK tablet Take 6 pills on day one then decrease by 1 pill each day 01/28/22   Versie Starks, PA-C    Allergies Tramadol  No family history on file.  Social History Social History   Tobacco Use    Smoking status: Every Day    Packs/day: 0.25    Types: Cigarettes   Smokeless tobacco: Never  Substance Use Topics   Alcohol use: Yes   Drug use: Never    Review of Systems  Constitutional: No fever/chills Eyes: No visual changes. ENT: No sore throat.  Positive for facial pain/pressure and congestion. Cardiovascular: Denies chest pain. Respiratory: Denies shortness of breath. Gastrointestinal: No abdominal pain.  No nausea, no vomiting.  No diarrhea.  No constipation. Genitourinary: Negative for dysuria. Musculoskeletal: Negative for back pain. Skin: Negative for rash. Neurological: Negative for focal weakness or numbness.  Positive for headache   ____________________________________________   PHYSICAL EXAM:  VITAL SIGNS: ED Triage Vitals  Enc Vitals Group     BP 02/04/22 2011 136/84     Pulse Rate 02/04/22 2011 91     Resp 02/04/22 2011 16     Temp 02/04/22 2011 98.1 F (36.7 C)     Temp Source 02/04/22 2011 Oral     SpO2 02/04/22 2011 98 %     Weight 02/04/22 2009 225 lb (102.1 kg)     Height --      Head Circumference --      Peak Flow --      Pain Score 02/04/22 2009 0     Pain Loc --      Pain Edu? --      Excl. in [place]? --     Constitutional: Alert and oriented. Well appearing and  in no acute distress. Eyes: Conjunctivae are normal. PERRL. EOMI. Head: Atraumatic.  Patient is tender to palpation over frontal and maxillary sinuses. Nose: No rhinorrhea.  Positive for congestion. Mouth/Throat: Mucous membranes are moist.  Oropharynx non-erythematous. Neck: No stridor.   Cardiovascular: Normal rate, regular rhythm. Grossly normal heart sounds.  Good peripheral circulation. Respiratory: Normal respiratory effort.  No retractions. Lungs CTAB. Gastrointestinal: Soft and nontender. No distention. No abdominal bruits. No CVA tenderness. Musculoskeletal: No lower extremity tenderness nor edema.  No joint effusions. Neurologic:  Normal speech and language. No gross  focal neurologic deficits are appreciated. No gait instability. Skin:  Skin is warm, dry and intact. No rash noted. Psychiatric: Mood and affect are normal. Speech and behavior are normal.  ____________________________________________   LABS (all labs ordered are listed, but only abnormal results are displayed)  Labs Reviewed - No data to display ____________________________________________  EKG   ____________________________________________  RADIOLOGY  ED MD interpretation:    Official radiology report(s): No results found.  ____________________________________________   PROCEDURES  Procedure(s) performed: None  Procedures  Critical Care performed: No  ____________________________________________   INITIAL IMPRESSION / ASSESSMENT AND PLAN / ED COURSE     Marissa Rocha is a 47 y.o. female presents to the emergency room with complaint of headache/facial pressure and pain.  She denies having epistaxis.  She states that she does have a sensation that feels like epistaxis but has never had a nosebleed as of yet.  Patient reports that she had the flu approximately a week and a half ago and since that time she has continued with headache/pressure in her face.  She denies any cough/fever/runny nose/nausea/vomiting/diarrhea. She has not taken anything for her symptoms at this time. Patient reports that she thought that her blood pressure was causing her to have headaches.  However, she has never been diagnosed with hypertension.  Her blood pressure in the ED today is 136/84 and I do not feel that this is causing her symptoms.   Patient will be diagnosed with sinusitis.  She will be treated with Augmentin twice a day for the next 10 days.  I have discussed with her that if her symptoms persist or worsen that she should follow-up with her primary care provider.      ____________________________________________   FINAL CLINICAL IMPRESSION(S) / ED DIAGNOSES  Final diagnoses:   Sinusitis, unspecified chronicity, unspecified location     ED Discharge Orders          Ordered    amoxicillin-clavulanate (AUGMENTIN) 875-125 MG tablet  2 times daily        02/04/22 2048             Note:  This document was prepared using Dragon voice recognition software and may include unintentional dictation errors.     Willaim Rayas, NP 02/04/22 2053    Duffy Bruce, MD 02/05/22 [phone]

## 2022-02-04 NOTE — ED Triage Notes (Signed)
Pt sts that she wants a BP check cause something is not right. Pt sts that she feels like her nose is bleeding but its not.

## 2022-02-04 NOTE — Discharge Instructions (Addendum)
You have been seen today in the emergency room and diagnosed with a sinus infection.  You will be treated with an antibiotic that you should take twice a day for the next 10 days.  If your symptoms persist or worsen please follow-up with your primary care provider.

## 2022-03-10 ENCOUNTER — Emergency Department
Admission: EM | Admit: 2022-03-10 | Discharge: 2022-03-10 | Disposition: A | Discharge status: Home / Self Care | Payer: Federal, State, Local not specified - PPO | Attending: Emergency Medicine | Admitting: Emergency Medicine

## 2022-03-10 ENCOUNTER — Emergency Department: Payer: Federal, State, Local not specified - PPO

## 2022-03-10 ENCOUNTER — Other Ambulatory Visit: Payer: Self-pay

## 2022-03-10 DIAGNOSIS — M549 Dorsalgia, unspecified: Secondary | ICD-10-CM | POA: Diagnosis not present

## 2022-03-10 DIAGNOSIS — D72829 Elevated white blood cell count, unspecified: Secondary | ICD-10-CM | POA: Diagnosis not present

## 2022-03-10 DIAGNOSIS — R059 Cough, unspecified: Secondary | ICD-10-CM | POA: Diagnosis present

## 2022-03-10 DIAGNOSIS — J181 Lobar pneumonia, unspecified organism: Secondary | ICD-10-CM | POA: Diagnosis not present

## 2022-03-10 DIAGNOSIS — R791 Abnormal coagulation profile: Secondary | ICD-10-CM | POA: Insufficient documentation

## 2022-03-10 DIAGNOSIS — J189 Pneumonia, unspecified organism: Secondary | ICD-10-CM

## 2022-03-10 DIAGNOSIS — Z20822 Contact with and (suspected) exposure to covid-19: Secondary | ICD-10-CM | POA: Diagnosis not present

## 2022-03-10 LAB — CBC WITH DIFFERENTIAL/PLATELET
Abs Immature Granulocytes: 0.1 10*3/uL — ABNORMAL HIGH (ref 0.00–0.07)
Basophils Absolute: 0.1 10*3/uL (ref 0.0–0.1)
Basophils Relative: 0 %
Eosinophils Absolute: 0.1 10*3/uL (ref 0.0–0.5)
Eosinophils Relative: 1 %
HCT: 41.9 % (ref 36.0–46.0)
Hemoglobin: 14 g/dL (ref 12.0–15.0)
Immature Granulocytes: 1 %
Lymphocytes Relative: 12 %
Lymphs Abs: 2.3 10*3/uL (ref 0.7–4.0)
MCH: 31.7 pg (ref 26.0–34.0)
MCHC: 33.4 g/dL (ref 30.0–36.0)
MCV: 94.8 fL (ref 80.0–100.0)
Monocytes Absolute: 1.2 10*3/uL — ABNORMAL HIGH (ref 0.1–1.0)
Monocytes Relative: 7 %
Neutro Abs: 15.1 10*3/uL — ABNORMAL HIGH (ref 1.7–7.7)
Neutrophils Relative %: 79 %
Platelets: 292 10*3/uL (ref 150–400)
RBC: 4.42 MIL/uL (ref 3.87–5.11)
RDW: 14 % (ref 11.5–15.5)
WBC: 18.9 10*3/uL — ABNORMAL HIGH (ref 4.0–10.5)
nRBC: 0 % (ref 0.0–0.2)

## 2022-03-10 LAB — BASIC METABOLIC PANEL
Anion gap: 8 (ref 5–15)
BUN: 10 mg/dL (ref 6–20)
CO2: 24 mmol/L (ref 22–32)
Calcium: 9 mg/dL (ref 8.9–10.3)
Chloride: 102 mmol/L (ref 98–111)
Creatinine, Ser: 0.71 mg/dL (ref 0.44–1.00)
GFR, Estimated: >60 mL/min (ref >60)
Glucose, Bld: 70 mg/dL (ref 70–99)
Potassium: 3.8 mmol/L (ref 3.5–5.1)
Sodium: 134 mmol/L — ABNORMAL LOW (ref 135–145)

## 2022-03-10 LAB — D-DIMER, QUANTITATIVE: D-Dimer, Quant: 1.11 ug/mL-FEU — ABNORMAL HIGH (ref 0.00–0.50)

## 2022-03-10 LAB — TROPONIN I (HIGH SENSITIVITY): Troponin I (High Sensitivity): 5 ng/L (ref <18)

## 2022-03-10 LAB — RESP PANEL BY RT-PCR (RSV, FLU A&B, COVID)  RVPGX2
Influenza A by PCR: NEGATIVE
Influenza B by PCR: NEGATIVE
Resp Syncytial Virus by PCR: NEGATIVE
SARS Coronavirus 2 by RT PCR: NEGATIVE

## 2022-03-10 LAB — GROUP A STREP BY PCR: Group A Strep by PCR: NOT DETECTED

## 2022-03-10 MED ORDER — FLUCONAZOLE 150 MG PO TABS: 150.0000 mg | ORAL_TABLET | Freq: Every day | ORAL | 0 refills | Status: DC

## 2022-03-10 MED ORDER — IOHEXOL 350 MG/ML SOLN
75.0000 mL | Freq: Once | INTRAVENOUS | Status: AC | PRN
  Administered 2022-03-10: 75 mL via INTRAVENOUS

## 2022-03-10 MED ORDER — AZITHROMYCIN 250 MG PO TABS: ORAL_TABLET | ORAL | 0 refills | Status: DC

## 2022-03-10 MED ORDER — CEFDINIR 300 MG PO CAPS: 300.0000 mg | ORAL_CAPSULE | Freq: Two times a day (BID) | ORAL | 0 refills | Status: AC

## 2022-03-10 NOTE — Discharge Instructions (Addendum)
Chest x-ray suspicious for developing pneumonia.  Your CT angiogram does not reveal pulmonary embolism.  Please take the antibiotics as prescribed.  Please return for any new, worsening, or change in symptoms or other concerns.  It was a pleasure caring for you today.

## 2022-03-10 NOTE — ED Triage Notes (Signed)
Pt presents via POV c/o sore throat and SOB. Reports started out as allergies and dx with bronchitis treated with abx. Reports abx switched from Augmentin to Penicillin for dental abscess. Ambulatory to triage.

## 2022-03-10 NOTE — ED Provider Notes (Signed)
[facility] Provider Note    Event Date/Time   First MD Initiated Contact with Patient 03/10/22 1155     (approximate)   History   Sore Throat   HPI  Marissa Rocha is a 47 y.o. female who presents today for evaluation of multiple complaints.  Patient reports that her symptoms began approximately 1 month ago when her entire family had the flu.  She reports that her family is gotten better, however her symptoms have persisted and that she has continued to have cough, fatigue, and sore throat.  She reports that over the last 1 to 2 weeks she is also developed chest pain, shortness of breath, and upper back pain.  She has a family history of blood clots, though she herself does not have a blood clot.  She reports that she has chest pressure, that is worsened with deep inspiration.  She has not noticed any unilateral leg swelling or calf pain.  No recent trips or travel.  No periods of immobilization, though reports that she is sedentary at home.  There are no problems to display for this patient.         Physical Exam   Triage Vital Signs: ED Triage Vitals  Enc Vitals Group     BP 03/10/22 1148 (!) 148/86     Pulse Rate 03/10/22 1148 87     Resp 03/10/22 1148 18     Temp --      Temp src --      SpO2 03/10/22 1148 97 %     Weight 03/10/22 1147 225 lb (102.1 kg)     Height 03/10/22 1147 '5\' 8"'$  (1.727 m)     Head Circumference --      Peak Flow --      Pain Score 03/10/22 1147 6     Pain Loc --      Pain Edu? --      Excl. in [place]? --     Most recent vital signs: Vitals:   03/10/22 1148  BP: (!) 148/86  Pulse: 87  Resp: 18  SpO2: 97%    Physical Exam Vitals and nursing note reviewed.  Constitutional:      General: Awake and alert. No acute distress.    Appearance: Normal appearance. The patient is obese.  HENT:     Head: Normocephalic and atraumatic.     Mouth: Mucous membranes are moist.  Nasal congestion present Eyes:     General:  PERRL. Normal EOMs        Right eye: No discharge.        Left eye: No discharge.     Conjunctiva/sclera: Conjunctivae normal.  Cardiovascular:     Rate and Rhythm: Normal rate and regular rhythm.     Pulses: Normal pulses.  Pulmonary:     Effort: Pulmonary effort is normal. No respiratory distress.     Breath sounds: Normal breath sounds.  Abdominal:     Abdomen is soft. There is no abdominal tenderness. No rebound or guarding. No distention. Musculoskeletal:        General: No swelling. Normal range of motion.     Cervical back: Normal range of motion and neck supple.  No lower extremity edema Skin:    General: Skin is warm and dry.     Capillary Refill: Capillary refill takes less than 2 seconds.     Findings: No rash.  Neurological:     Mental Status: The patient is awake and alert.  ED Results / Procedures / Treatments   Labs (all labs ordered are listed, but only abnormal results are displayed) Labs Reviewed  CBC WITH DIFFERENTIAL/PLATELET - Abnormal; Notable for the following components:      Result Value   WBC 18.9 (*)    Neutro Abs 15.1 (*)    Monocytes Absolute 1.2 (*)    Abs Immature Granulocytes 0.10 (*)    All other components within normal limits  D-DIMER, QUANTITATIVE - Abnormal; Notable for the following components:   D-Dimer, Quant 1.11 (*)    All other components within normal limits  BASIC METABOLIC PANEL - Abnormal; Notable for the following components:   Sodium 134 (*)    All other components within normal limits  RESP PANEL BY RT-PCR (RSV, FLU A&B, COVID)  RVPGX2  GROUP A STREP BY PCR  TROPONIN I (HIGH SENSITIVITY)  TROPONIN I (HIGH SENSITIVITY)     EKG     RADIOLOGY I independently reviewed and interpreted imaging and agree with radiologists findings.     PROCEDURES:  Critical Care performed:   Procedures   MEDICATIONS ORDERED IN ED: Medications  iohexol (OMNIPAQUE) 350 MG/ML injection 75 mL (75 mLs Intravenous Contrast  Given 03/10/22 1348)     IMPRESSION / MDM / ASSESSMENT AND PLAN / ED COURSE  I reviewed the triage vital signs and the nursing notes.   Differential diagnosis includes, but is not limited to, bronchitis, pneumonia, COVID, influenza, pulmonary embolism, other viral syndrome.  Patient is awake and alert, hemodynamically stable and nontoxic in appearance.  COVID/flu/RSV swab is negative.  She did report that she had some chest heaviness, worsened with deep inspiration, and has a family history of PE/DVT.  She is not tachycardic or hypoxic.  However, given her family history, her chest pain, and her pleurisy, she agreed to further workup including D-dimer.  Labs obtained reveal a leukocytosis to 18.  Troponin is negative.  No negation for repeat troponin given duration of symptoms.  She has a hazy left lower lobe opacity that is suspicious for developing pneumonia, and clinically her history is concerning for developing pneumonia.  She did have an elevated D-dimer, therefore she agreed to pursue CT angiogram which was negative for blood clot.  Patient was reassured by these findings.  She was treated with antibiotics for community-acquired pneumonia.  She requested a prescription for Diflucan given that she gets yeast infections when she takes antibiotics.  This was also provided for her.  We discussed return precautions and the importance of close outpatient follow-up.  Patient understands and agrees with plan.  She was discharged in stable condition.   Patient's presentation is most consistent with acute complicated illness / injury requiring diagnostic workup.     FINAL CLINICAL IMPRESSION(S) / ED DIAGNOSES   Final diagnoses:  Community acquired pneumonia of left lower lobe of lung     Rx / DC Orders   ED Discharge Orders          Ordered    cefdinir (OMNICEF) 300 MG capsule  2 times daily        03/10/22 1418    azithromycin (ZITHROMAX Z-PAK) 250 MG tablet        03/10/22 1418     fluconazole (DIFLUCAN) 150 MG tablet  Daily        03/10/22 1419             Note:  This document was prepared using Dragon voice recognition software and may include unintentional dictation errors.  Emeline Gins 03/10/22 1605    Harvest Dark, MD 03/10/22 1845

## 2022-03-11 ENCOUNTER — Other Ambulatory Visit: Payer: Self-pay

## 2022-03-11 ENCOUNTER — Encounter: Payer: Self-pay | Admitting: Emergency Medicine

## 2022-03-11 ENCOUNTER — Emergency Department
Admission: EM | Admit: 2022-03-11 | Discharge: 2022-03-11 | Disposition: A | Discharge status: Home / Self Care | Payer: Federal, State, Local not specified - PPO | Attending: Emergency Medicine | Admitting: Emergency Medicine

## 2022-03-11 DIAGNOSIS — Z91148 Patient's other noncompliance with medication regimen for other reason: Secondary | ICD-10-CM | POA: Diagnosis not present

## 2022-03-11 DIAGNOSIS — R059 Cough, unspecified: Secondary | ICD-10-CM | POA: Diagnosis present

## 2022-03-11 DIAGNOSIS — R051 Acute cough: Secondary | ICD-10-CM | POA: Diagnosis not present

## 2022-03-11 MED ORDER — AZITHROMYCIN 500 MG PO TABS
500.0000 mg | ORAL_TABLET | Freq: Once | ORAL | Status: AC
  Administered 2022-03-11: 500 mg via ORAL
  Filled 2022-03-11: qty 1

## 2022-03-11 MED ORDER — CEFDINIR 300 MG PO CAPS
300.0000 mg | ORAL_CAPSULE | Freq: Once | ORAL | Status: AC
  Administered 2022-03-11: 300 mg via ORAL
  Filled 2022-03-11: qty 1

## 2022-03-11 MED ORDER — ONDANSETRON 4 MG PO TBDP
4.0000 mg | ORAL_TABLET | Freq: Once | ORAL | Status: AC
  Administered 2022-03-11: 4 mg via ORAL
  Filled 2022-03-11: qty 1

## 2022-03-11 MED ORDER — ACETAMINOPHEN 500 MG PO TABS
1000.0000 mg | ORAL_TABLET | Freq: Once | ORAL | Status: AC
  Administered 2022-03-11: 1000 mg via ORAL
  Filled 2022-03-11: qty 2

## 2022-03-11 MED ORDER — KETOROLAC TROMETHAMINE 30 MG/ML IJ SOLN
30.0000 mg | Freq: Once | INTRAMUSCULAR | Status: AC
  Administered 2022-03-11: 30 mg via INTRAMUSCULAR
  Filled 2022-03-11: qty 1

## 2022-03-11 NOTE — ED Notes (Signed)
Pt verbalizes understanding of discharge instructions, Signature pad in treatment room not working

## 2022-03-11 NOTE — ED Triage Notes (Signed)
Pt to ED via POV, states was seen earlier today and dx with pneumonia. Pt states was she was laying down she had increasing cough and increasing SOB. Pt states has been unable to start her abx since being discharge due to the pharmacy closing early. Pt A&O x4, able to speak in full and complete sentences at this time.

## 2022-03-11 NOTE — ED Notes (Signed)
RN to bedside to introduce self to pt. Pt resting comfortably.  

## 2022-03-11 NOTE — ED Provider Notes (Signed)
[facility] Provider Note    Event Date/Time   First MD Initiated Contact with Patient 03/11/22 [id number]     (approximate)   History   Cough   HPI  Marissa Rocha is a 47 y.o. female who presents to the ED for evaluation of Cough   I reviewed ED visit from earlier today, about 12 hours ago, where patient was evaluated for dyspnea and workup consistent with community-acquired pneumonia and was discharged with cefdinir and a Z-Pak.  She had a CTA chest PE study without PE.  She returns to the ED with concerns for cough and diffuse malaise.  Reports that she had dyspnea earlier but no cough but developed a cough since she left.  Reports fatigue and "everything feeling worse."  No syncope, falls or particular events.  She reports that she was unable to pick up her prescribed outpatient medications because she arrived at the pharmacy just after closing.  She therefore returns to the ED for evaluation and concerns for missed medications.   Physical Exam   Triage Vital Signs: ED Triage Vitals  Enc Vitals Group     BP 03/11/22 0015 122/82     Pulse Rate 03/11/22 0015 93     Resp 03/11/22 0015 20     Temp 03/11/22 0015 98.6 F (37 C)     Temp Source 03/11/22 0015 Oral     SpO2 03/11/22 0015 96 %     Weight 03/11/22 0017 225 lb (102.1 kg)     Height 03/11/22 0017 '5\' 8"'$  (1.727 m)     Head Circumference --      Peak Flow --      Pain Score 03/11/22 0021 8     Pain Loc --      Pain Edu? --      Excl. in [place]? --     Most recent vital signs: Vitals:   03/11/22 0015  BP: 122/82  Pulse: 93  Resp: 20  Temp: 98.6 F (37 C)  SpO2: 96%    General: Awake, no distress.  Well-appearing, pleasant, fluent and conversational full sentences. CV:  Good peripheral perfusion.  Resp:  Normal effort.  Abd:  No distention.  MSK:  No deformity noted.  Neuro:  No focal deficits appreciated. Other:     ED Results / Procedures / Treatments   Labs (all labs ordered are  listed, but only abnormal results are displayed) Labs Reviewed - No data to display  EKG Sinus rhythm with a rate of 92 bpm.  Normal axis and intervals.  Nonspecific ST changes inferiorly without STEMI.  Similar to previous  RADIOLOGY   Official radiology report(s): CT Angio Chest PE W and/or Wo Contrast  Result Date: 03/10/2022 CLINICAL DATA:  Pulmonary embolism (PE) suspected, low to intermediate prob, positive D-dimer EXAM: CT ANGIOGRAPHY CHEST WITH CONTRAST TECHNIQUE: Multidetector CT imaging of the chest was performed using the standard protocol during bolus administration of intravenous contrast. Multiplanar CT image reconstructions and MIPs were obtained to evaluate the vascular anatomy. RADIATION DOSE REDUCTION: This exam was performed according to the departmental dose-optimization program which includes automated exposure control, adjustment of the mA and/or kV according to patient size and/or use of iterative reconstruction technique. CONTRAST:  75 mL OMNIPAQUE IOHEXOL 350 MG/ML SOLN COMPARISON:  None Available. FINDINGS: Cardiovascular: Satisfactory opacification of the pulmonary arteries to the segmental level. No evidence of pulmonary embolism. Normal heart size. No pericardial effusion. Mediastinum/Nodes: No enlarged mediastinal, hilar, or axillary lymph nodes.  Thyroid gland, trachea, and esophagus demonstrate no significant findings. Lungs/Pleura: Lungs are clear. No pleural effusion or pneumothorax. Upper Abdomen: No acute abnormality. Musculoskeletal: No chest wall abnormality. No acute or significant osseous findings. Review of the MIP images confirms the above findings. IMPRESSION: No evidence of PE, aneurysm or dissection. Electronically Signed   By: Sammie Bench M.D.   On: 03/10/2022 14:03   DG Chest 2 View  Result Date: 03/10/2022 CLINICAL DATA:  Shortness of breath and sore throat EXAM: CHEST - 2 VIEW COMPARISON:  Chest radiograph dated 01/28/2022 FINDINGS: Normal lung  volumes. Patchy left basilar opacity seen on frontal view without correlate on lateral view. No pleural effusion or pneumothorax. The heart size and mediastinal contours are within normal limits. The visualized skeletal structures are unremarkable. IMPRESSION: Patchy left basilar opacity, likely atelectasis. No focal consolidations. Electronically Signed   By: Darrin Nipper M.D.   On: 03/10/2022 12:26    PROCEDURES and INTERVENTIONS:  Procedures  Medications  ketorolac (TORADOL) 30 MG/ML injection 30 mg (30 mg Intramuscular Given 03/11/22 0120)  acetaminophen (TYLENOL) tablet 1,000 mg (1,000 mg Oral Given 03/11/22 0120)  cefdinir (OMNICEF) capsule 300 mg (300 mg Oral Given 03/11/22 0122)  azithromycin (ZITHROMAX) tablet 500 mg (500 mg Oral Given 03/11/22 0119)  ondansetron (ZOFRAN-ODT) disintegrating tablet 4 mg (4 mg Oral Given 03/11/22 0120)     IMPRESSION / MDM / ASSESSMENT AND PLAN / ED COURSE  I reviewed the triage vital signs and the nursing notes.  Differential diagnosis includes, but is not limited to, sepsis or septic shock, dehydration, anxiety, malingering, dehydration  {Patient presents with symptoms of an acute illness or injury that is potentially life-threatening.  47 year old who was recently diagnosed with pneumonia presents with developing a cough and unable to pick up her outpatient medications.  She looks well to me.  Considering the extensive workup she just recently had, we will forego diagnostics at this point and provide appropriate medications.  We will provide her antibiotics, Toradol and antiemetics, oral rehydration and reassessment.  Anticipate she will be suitable for outpatient management.  She reports that she can pick up her prescriptions at the pharmacy tomorrow.  Clinical Course as of 03/11/22 0300  Sun Mar 11, 2022  0158 Reassessed.  Feeling better.  No barriers to outpatient management. [DS]    Clinical Course User Index [DS] Vladimir Crofts, MD     FINAL  CLINICAL IMPRESSION(S) / ED DIAGNOSES   Final diagnoses:  Acute cough  Patient's other noncompliance with medication regimen for other reason     Rx / DC Orders   ED Discharge Orders     None        Note:  This document was prepared using Dragon voice recognition software and may include unintentional dictation errors.   Vladimir Crofts, MD 03/11/22 [phone]

## 2022-03-11 NOTE — ED Triage Notes (Signed)
This RN discussed patient care with Dr. Tamala Julian, per Dr. Tamala Julian no further orders at this time.

## 2022-03-12 ENCOUNTER — Emergency Department: Payer: Federal, State, Local not specified - PPO

## 2022-03-12 DIAGNOSIS — R0602 Shortness of breath: Secondary | ICD-10-CM | POA: Diagnosis present

## 2022-03-12 DIAGNOSIS — J40 Bronchitis, not specified as acute or chronic: Secondary | ICD-10-CM | POA: Diagnosis not present

## 2022-03-12 DIAGNOSIS — Z20822 Contact with and (suspected) exposure to covid-19: Secondary | ICD-10-CM | POA: Insufficient documentation

## 2022-03-12 LAB — COMPREHENSIVE METABOLIC PANEL
ALT: 21 U/L (ref 0–44)
AST: 25 U/L (ref 15–41)
Albumin: 3.8 g/dL (ref 3.5–5.0)
Alkaline Phosphatase: 90 U/L (ref 38–126)
Anion gap: 7 (ref 5–15)
BUN: 8 mg/dL (ref 6–20)
CO2: 23 mmol/L (ref 22–32)
Calcium: 8.6 mg/dL — ABNORMAL LOW (ref 8.9–10.3)
Chloride: 105 mmol/L (ref 98–111)
Creatinine, Ser: 0.64 mg/dL (ref 0.44–1.00)
GFR, Estimated: >60 mL/min (ref >60)
Glucose, Bld: 117 mg/dL — ABNORMAL HIGH (ref 70–99)
Potassium: 3.4 mmol/L — ABNORMAL LOW (ref 3.5–5.1)
Sodium: 135 mmol/L (ref 135–145)
Total Bilirubin: 0.4 mg/dL (ref 0.3–1.2)
Total Protein: 7.5 g/dL (ref 6.5–8.1)

## 2022-03-12 LAB — CBC WITH DIFFERENTIAL/PLATELET
Abs Immature Granulocytes: 0.05 10*3/uL (ref 0.00–0.07)
Basophils Absolute: 0.1 10*3/uL (ref 0.0–0.1)
Basophils Relative: 0 %
Eosinophils Absolute: 0.3 10*3/uL (ref 0.0–0.5)
Eosinophils Relative: 2 %
HCT: 41.5 % (ref 36.0–46.0)
Hemoglobin: 13.9 g/dL (ref 12.0–15.0)
Immature Granulocytes: 0 %
Lymphocytes Relative: 19 %
Lymphs Abs: 2.9 10*3/uL (ref 0.7–4.0)
MCH: 31.4 pg (ref 26.0–34.0)
MCHC: 33.5 g/dL (ref 30.0–36.0)
MCV: 93.7 fL (ref 80.0–100.0)
Monocytes Absolute: 0.9 10*3/uL (ref 0.1–1.0)
Monocytes Relative: 6 %
Neutro Abs: 11 10*3/uL — ABNORMAL HIGH (ref 1.7–7.7)
Neutrophils Relative %: 73 %
Platelets: 327 10*3/uL (ref 150–400)
RBC: 4.43 MIL/uL (ref 3.87–5.11)
RDW: 13.7 % (ref 11.5–15.5)
WBC: 15.1 10*3/uL — ABNORMAL HIGH (ref 4.0–10.5)
nRBC: 0 % (ref 0.0–0.2)

## 2022-03-12 LAB — RESP PANEL BY RT-PCR (RSV, FLU A&B, COVID)  RVPGX2
Influenza A by PCR: NEGATIVE
Influenza B by PCR: NEGATIVE
Resp Syncytial Virus by PCR: NEGATIVE
SARS Coronavirus 2 by RT PCR: NEGATIVE

## 2022-03-12 LAB — TROPONIN I (HIGH SENSITIVITY): Troponin I (High Sensitivity): 3 ng/L (ref <18)

## 2022-03-12 NOTE — ED Notes (Signed)
Patient stood up from recliner and had episode of dizziness and SOB. Sat patient back down and into a wheelchair. Marissa Pigeon RN notified of event. Stated patient ok to go back out to waiting room.

## 2022-03-12 NOTE — ED Triage Notes (Signed)
Ambulatory to triage with c/o shortness of breath x 1 week. Recently here for same sx, but feels she is getting worse. Reports she has been unable to get antibiotics.

## 2022-03-13 ENCOUNTER — Emergency Department
Admission: EM | Admit: 2022-03-13 | Discharge: 2022-03-13 | Disposition: A | Discharge status: Home / Self Care | Payer: Federal, State, Local not specified - PPO | Attending: Emergency Medicine | Admitting: Emergency Medicine

## 2022-03-13 DIAGNOSIS — J40 Bronchitis, not specified as acute or chronic: Secondary | ICD-10-CM

## 2022-03-13 LAB — TROPONIN I (HIGH SENSITIVITY): Troponin I (High Sensitivity): <2 ng/L (ref <18)

## 2022-03-13 MED ORDER — KETOROLAC TROMETHAMINE 30 MG/ML IJ SOLN
30.0000 mg | Freq: Once | INTRAMUSCULAR | Status: AC
  Administered 2022-03-13: 30 mg via INTRAMUSCULAR
  Filled 2022-03-13: qty 1

## 2022-03-13 NOTE — Discharge Instructions (Addendum)
Please take Tylenol and ibuprofen/Advil for your pain.  It is safe to take them together, or to alternate them every few hours.  Take up to 1000mg of Tylenol at a time, up to 4 times per day.  Do not take more than 4000 mg of Tylenol in 24 hours.  For ibuprofen, take 400-600 mg, 3 - 4 times per day.  

## 2022-03-13 NOTE — ED Provider Notes (Signed)
[facility] Provider Note    Event Date/Time   First MD Initiated Contact with Patient 03/13/22 0206     (approximate)   History   Shortness of Breath   HPI  Marissa Rocha is a 47 y.o. female who presents to the ED for evaluation of Shortness of Breath   I review ED visit from 3/9 and 3/10.  I saw her on the 10th and recall her after reviewing the chart.  Negative CTA chest on 3/9 but was discharged with a prescription for antibiotics to treat CAP possibly seen on a preceding CXR when she was seen on 3/9.  Patient returns to the ED because of 2 episodes of hemoptysis while at work tonight.  She reports frequent coughing that has been present for the past week that persists and without significant changing.  No new production beyond some pink bloody streaking in her sputum this evening while at work that scared her so she presents to the ED for evaluation.  No chest pain, syncope, fevers abdominal pain or other derangements.   Physical Exam   Triage Vital Signs: ED Triage Vitals [03/12/22 2137]  Enc Vitals Group     BP 126/87     Pulse Rate 78     Resp 18     Temp 98.1 F (36.7 C)     Temp Source Oral     SpO2 97 %     Weight 225 lb (102.1 kg)     Height '5\' 8"'$  (1.727 m)     Head Circumference      Peak Flow      Pain Score 6     Pain Loc      Pain Edu?      Excl. in [place]?     Most recent vital signs: Vitals:   03/13/22 0009 03/13/22 0255  BP: 115/80 116/60  Pulse: 74 75  Resp: 18 18  Temp:  98.2 F (36.8 C)  SpO2: 97% 97%    General: Awake, no distress.  CV:  Good peripheral perfusion.  Resp:  Normal effort.  Abd:  No distention.  MSK:  No deformity noted.  Neuro:  No focal deficits appreciated. Other:     ED Results / Procedures / Treatments   Labs (all labs ordered are listed, but only abnormal results are displayed) Labs Reviewed  CBC WITH DIFFERENTIAL/PLATELET - Abnormal; Notable for the following components:      Result  Value   WBC 15.1 (*)    Neutro Abs 11.0 (*)    All other components within normal limits  COMPREHENSIVE METABOLIC PANEL - Abnormal; Notable for the following components:   Potassium 3.4 (*)    Glucose, Bld 117 (*)    Calcium 8.6 (*)    All other components within normal limits  RESP PANEL BY RT-PCR (RSV, FLU A&B, COVID)  RVPGX2  TROPONIN I (HIGH SENSITIVITY)  TROPONIN I (HIGH SENSITIVITY)    EKG Sinus rhythm with a rate of 88 bpm.  Normal axis and intervals.  Nonspecific ST changes inferiorly without STEMI. Similar morphology to previous EKGs  RADIOLOGY CXR interpreted by me without evidence of acute cardiopulmonary pathology.  Official radiology report(s): DG Chest Portable 1 View  Result Date: 03/12/2022 CLINICAL DATA:  Dyspnea EXAM: PORTABLE CHEST 1 VIEW COMPARISON:  Chest x-ray 03/10/2022 FINDINGS: The heart size and mediastinal contours are within normal limits. Both lungs are clear. The visualized skeletal structures are unremarkable. IMPRESSION: No active disease. Electronically Signed  By: Ronney Asters M.D.   On: 03/12/2022 22:11    PROCEDURES and INTERVENTIONS:  Procedures  Medications  ketorolac (TORADOL) 30 MG/ML injection 30 mg (30 mg Intramuscular Given 03/13/22 0245)     IMPRESSION / MDM / ASSESSMENT AND PLAN / ED COURSE  I reviewed the triage vital signs and the nursing notes.  Differential diagnosis includes, but is not limited to, Mallory-Weiss tear, bronchitis, viral syndrome, PE,  {Patient presents with symptoms of an acute illness or injury that is potentially life-threatening.  47 year old female presents to the ED with continued symptoms of likely bronchitis.  Look systemically well has normal vital signs and workup.  Nonischemic EKG, clear CXR.  Negative troponin.  Downtrending WBCs from her recent visits.  Metabolic panel is essentially normal.  She is asymptomatic when I see her.  I see no indications to repeat CTA chest considering her extensive  and benign workup in the past couple days.  I provided reassurance and discussed appropriate return precautions.  We discussed expectant management of bronchitis.      FINAL CLINICAL IMPRESSION(S) / ED DIAGNOSES   Final diagnoses:  Bronchitis     Rx / DC Orders   ED Discharge Orders     None        Note:  This document was prepared using Dragon voice recognition software and may include unintentional dictation errors.   Vladimir Crofts, MD 03/13/22 [phone]

## 2022-08-05 ENCOUNTER — Emergency Department: Payer: Federal, State, Local not specified - PPO

## 2022-08-05 ENCOUNTER — Other Ambulatory Visit: Payer: Self-pay

## 2022-08-05 DIAGNOSIS — R519 Headache, unspecified: Secondary | ICD-10-CM | POA: Diagnosis not present

## 2022-08-05 DIAGNOSIS — R42 Dizziness and giddiness: Secondary | ICD-10-CM | POA: Diagnosis present

## 2022-08-05 DIAGNOSIS — D72829 Elevated white blood cell count, unspecified: Secondary | ICD-10-CM | POA: Diagnosis not present

## 2022-08-05 LAB — CBC
HCT: 41.6 % (ref 36.0–46.0)
Hemoglobin: 14 g/dL (ref 12.0–15.0)
MCH: 31 pg (ref 26.0–34.0)
MCHC: 33.7 g/dL (ref 30.0–36.0)
MCV: 92.2 fL (ref 80.0–100.0)
Platelets: 391 10*3/uL (ref 150–400)
RBC: 4.51 MIL/uL (ref 3.87–5.11)
RDW: 12.9 % (ref 11.5–15.5)
WBC: 17.8 10*3/uL — ABNORMAL HIGH (ref 4.0–10.5)
nRBC: 0 % (ref 0.0–0.2)

## 2022-08-05 NOTE — ED Triage Notes (Signed)
Pt presents to ER with c/o HA, elevated BP, and some mild dizziness that started appx 20-30 minutes ago.  Pt reports that she was sitting down with another pt when she noticed her symptoms.  Pt denies hx of stroke, MI, HTN, or blood clots.  Pt denies any blurry vision, unilateral weakness, or other neuro symptoms.  Pt is A&O x4 and in NAD in triage.

## 2022-08-06 ENCOUNTER — Emergency Department
Admission: EM | Admit: 2022-08-06 | Discharge: 2022-08-06 | Disposition: A | Discharge status: Home / Self Care | Payer: Federal, State, Local not specified - PPO | Attending: Emergency Medicine | Admitting: Emergency Medicine

## 2022-08-06 DIAGNOSIS — R42 Dizziness and giddiness: Secondary | ICD-10-CM

## 2022-08-06 NOTE — ED Provider Notes (Signed)
[facility] Provider Note    Event Date/Time   First MD Initiated Contact with Patient 08/06/22 0010     (approximate)   History   Headache and Dizziness   HPI  Marissa Rocha is a 47 y.o. female who presents to the ED for evaluation of Headache and Dizziness   I review a clinic visit from March.  Minimal medical history.  Seen for bronchitis.  Patient was in triage, checking in her son for a separate complaint, when she developed an episode of presyncopal dizziness while sitting and so also checked herself in.  She reports that she has been having these episodes a few times over the past couple weeks.  Each episode is similar, often while seated and associated with emotional stress.  She reports that she is under a lot of stress at home between life, family and work.  By the time I see her, she reports feeling better.  Reports that she had a headache, with the dizziness, this is also improved.  No syncope or falls.  Reports developing palpitations after thinking about her dizziness, but this is also resolved.   Physical Exam   Triage Vital Signs: ED Triage Vitals  Encounter Vitals Group     BP 08/05/22 2309 133/86     Systolic BP Percentile --      Diastolic BP Percentile --      Pulse Rate 08/05/22 2309 89     Resp 08/05/22 2317 16     Temp 08/05/22 2309 97.9 F (36.6 C)     Temp Source 08/05/22 2309 Oral     SpO2 08/05/22 2309 98 %     Weight 08/05/22 2315 225 lb (102.1 kg)     Height 08/05/22 2315 5\' 8"  (1.727 m)     Head Circumference --      Peak Flow --      Pain Score 08/05/22 2315 9     Pain Loc --      Pain Education --      Exclude from Growth Chart --     Most recent vital signs: Vitals:   08/05/22 2317 08/06/22 0059  BP:  131/83  Pulse:  86  Resp: 16 17  Temp:    SpO2:  98%    General: Awake, no distress.  Obese, pleasant and conversational.  Well-appearing. CV:  Good peripheral perfusion.  Resp:  Normal effort.   Abd:  No distention.  MSK:  No deformity noted.  Neuro:  No focal deficits appreciated. Cranial nerves II through XII intact 5/5 strength and sensation in all 4 extremities Other:     ED Results / Procedures / Treatments   Labs (all labs ordered are listed, but only abnormal results are displayed) Labs Reviewed  CBC - Abnormal; Notable for the following components:      Result Value   WBC 17.8 (*)    All other components within normal limits  COMPREHENSIVE METABOLIC PANEL - Abnormal; Notable for the following components:   Total Bilirubin 0.2 (*)    All other components within normal limits  URINALYSIS, ROUTINE W REFLEX MICROSCOPIC - Abnormal; Notable for the following components:   Color, Urine YELLOW (*)    APPearance HAZY (*)    Hgb urine dipstick MODERATE (*)    Bacteria, UA RARE (*)    All other components within normal limits  TROPONIN I (HIGH SENSITIVITY)  TROPONIN I (HIGH SENSITIVITY)    EKG Sinus rhythm with a rate of  83 bpm.  Normal axis and intervals.  T wave inversions to lead III.  No further signs of acute ischemia. Similar morphology to comparison from March  RADIOLOGY CXR interpreted by me without evidence of acute cardiopulmonary pathology. CT head interpreted by me without evidence of acute intracranial pathology  Official radiology report(s): DG Chest 1 View  Result Date: 08/05/2022 CLINICAL DATA:  Dizziness. EXAM: CHEST  1 VIEW COMPARISON:  March 12, 2022 FINDINGS: The heart size and mediastinal contours are within normal limits. Both lungs are clear. The visualized skeletal structures are unremarkable. IMPRESSION: No active disease. Electronically Signed   By: Aram Candela M.D.   On: 08/05/2022 23:45   CT HEAD WO CONTRAST ( )  Result Date: 08/05/2022 CLINICAL DATA:  Headache, neuro deficit EXAM: CT HEAD WITHOUT CONTRAST TECHNIQUE: Contiguous axial images were obtained from the base of the skull through the vertex without intravenous contrast.  RADIATION DOSE REDUCTION: This exam was performed according to the departmental dose-optimization program which includes automated exposure control, adjustment of the mA and/or kV according to patient size and/or use of iterative reconstruction technique. COMPARISON:  Head CT 10/30/2020 FINDINGS: Brain: No intracranial hemorrhage, mass effect, or midline shift. No hydrocephalus. The basilar cisterns are patent. No evidence of territorial infarct or acute ischemia. Partially empty sella, unchanged. No extra-axial or intracranial fluid collection. Vascular: No hyperdense vessel or unexpected calcification. Skull: No fracture or focal lesion. Sinuses/Orbits: Paranasal sinuses and mastoid air cells are clear. The visualized orbits are unremarkable. Other: None. IMPRESSION: 1. No acute intracranial abnormality. 2. Partially empty sella, unchanged. This is typically incidental, also can be seen with idiopathic intracranial hypertension. Electronically Signed   By: Narda Rutherford M.D.   On: 08/05/2022 23:39    PROCEDURES and INTERVENTIONS:  .1-3 Lead EKG Interpretation  Performed by: Delton Prairie, MD Authorized by: Delton Prairie, MD     Interpretation: normal     ECG rate:  72   ECG rate assessment: normal     Rhythm: sinus rhythm     Ectopy: none     Conduction: normal     Medications - No data to display   IMPRESSION / MDM / ASSESSMENT AND PLAN / ED COURSE  I reviewed the triage vital signs and the nursing notes.  Differential diagnosis includes, but is not limited to, vasovagal episode, UTI, dehydration, sepsis, BPPV, stroke, acute stress reaction  {Patient presents with symptoms of an acute illness or injury that is potentially life-threatening.  Patient presents for evaluation of subacute intermittent dizziness that sounds like vasovagal episodes and suitable for outpatient management.  At a time I see her she looks well and reports resolving symptoms.  She is a reassuring workup with normal  vital signs, reassuring EKG and negative troponin.  Normal metabolic panel.  Urine without infectious features.  Her leukocytosis is noted, but this has been present and all other CBCs in our system and appears to be degree of chronic.  I see no other evidence of infectious etiology of her symptoms or other sepsis/SIRS criteria.  Her CXR is clear as well as her CT head.  After discussing plan of care and shared decision making, she is suitable for outpatient management.      FINAL CLINICAL IMPRESSION(S) / ED DIAGNOSES   Final diagnoses:  Dizziness     Rx / DC Orders   ED Discharge Orders     None        Note:  This document was prepared using Dragon voice  recognition software and may include unintentional dictation errors.   Delton Prairie, MD 08/06/22 [phone]

## 2022-08-25 ENCOUNTER — Other Ambulatory Visit: Payer: Self-pay

## 2022-08-25 ENCOUNTER — Emergency Department
Admission: EM | Admit: 2022-08-25 | Discharge: 2022-08-25 | Disposition: A | Discharge status: Home / Self Care | Payer: Federal, State, Local not specified - PPO | Source: Home / Self Care

## 2022-08-25 DIAGNOSIS — U071 COVID-19: Secondary | ICD-10-CM | POA: Diagnosis not present

## 2022-08-25 DIAGNOSIS — B349 Viral infection, unspecified: Secondary | ICD-10-CM | POA: Diagnosis not present

## 2022-08-25 DIAGNOSIS — R059 Cough, unspecified: Secondary | ICD-10-CM | POA: Diagnosis present

## 2022-08-25 LAB — RESP PANEL BY RT-PCR (RSV, FLU A&B, COVID)  RVPGX2
Influenza A by PCR: NEGATIVE
Influenza B by PCR: NEGATIVE
Resp Syncytial Virus by PCR: NEGATIVE
SARS Coronavirus 2 by RT PCR: POSITIVE — AB

## 2022-08-25 MED ORDER — ACETAMINOPHEN 325 MG PO TABS
650.0000 mg | ORAL_TABLET | Freq: Once | ORAL | Status: AC
  Administered 2022-08-25: 650 mg via ORAL
  Filled 2022-08-25: qty 2

## 2022-08-25 MED ORDER — ONDANSETRON 4 MG PO TBDP: 4.0000 mg | ORAL_TABLET | Freq: Three times a day (TID) | ORAL | 0 refills | Status: AC | PRN

## 2022-08-25 MED ORDER — BENZONATATE 100 MG PO CAPS: 100.0000 mg | ORAL_CAPSULE | Freq: Three times a day (TID) | ORAL | 0 refills | Status: AC | PRN

## 2022-08-25 NOTE — Discharge Instructions (Signed)
Take Tessalon Perles and Zofran as directed.

## 2022-08-25 NOTE — ED Provider Notes (Signed)
[facility] Provider Note  Patient Contact: 11:14 PM (approximate)   History   flu like symptoms   HPI  Marissa Rocha is a 47 y.o. female presents to the emergency department with cough, body aches and fever for the past 2 days.  Patient has also had some nausea but no vomiting.  No chest pain, chest tightness or shortness of breath.  No vomiting or diarrhea.  No sick contacts in the home with similar symptoms      Physical Exam   Triage Vital Signs: ED Triage Vitals [08/25/22 1954]  Encounter Vitals Group     BP 120/68     Systolic BP Percentile      Diastolic BP Percentile      Pulse Rate 100     Resp 16     Temp (!) 101.2 F (38.4 C)     Temp Source Oral     SpO2 98 %     Weight 224 lb 13.9 oz (102 kg)     Height 5\' 8"  (1.727 m)     Head Circumference      Peak Flow      Pain Score 0     Pain Loc      Pain Education      Exclude from Growth Chart     Most recent vital signs: Vitals:   08/25/22 1954 08/25/22 2059  BP: 120/68   Pulse: 100   Resp: 16 18  Temp: (!) 101.2 F (38.4 C) 99.1 F (37.3 C)  SpO2: 98%      Constitutional: Alert and oriented. Patient is lying supine. Eyes: Conjunctivae are normal. PERRL. EOMI. Head: Atraumatic. ENT:      Ears: Tympanic membranes are mildly injected with mild effusion bilaterally.       Nose: No congestion/rhinnorhea.      Mouth/Throat: Mucous membranes are moist. Posterior pharynx is mildly erythematous.  Hematological/Lymphatic/Immunilogical: No cervical lymphadenopathy.  Cardiovascular: Normal rate, regular rhythm. Normal S1 and S2.  Good peripheral circulation. Respiratory: Normal respiratory effort without tachypnea or retractions. Lungs CTAB. Good air entry to the bases with no decreased or absent breath sounds. Gastrointestinal: Bowel sounds 4 quadrants. Soft and nontender to palpation. No guarding or rigidity. No palpable masses. No distention. No CVA tenderness. Musculoskeletal:  Full range of motion to all extremities. No gross deformities appreciated. Neurologic:  Normal speech and language. No gross focal neurologic deficits are appreciated.  Skin:  Skin is warm, dry and intact. No rash noted. Psychiatric: Mood and affect are normal. Speech and behavior are normal. Patient exhibits appropriate insight and judgement.    ED Results / Procedures / Treatments   Labs (all labs ordered are listed, but only abnormal results are displayed) Labs Reviewed  RESP PANEL BY RT-PCR (RSV, FLU A&B, COVID)  RVPGX2 - Abnormal; Notable for the following components:      Result Value   SARS Coronavirus 2 by RT PCR POSITIVE (*)    All other components within normal limits        PROCEDURES:  Critical Care performed: No  Procedures   MEDICATIONS ORDERED IN ED: Medications  acetaminophen (TYLENOL) tablet 650 mg (650 mg Oral Given 08/25/22 1959)     IMPRESSION / MDM / ASSESSMENT AND PLAN / ED COURSE  I reviewed the triage vital signs and the nursing notes.  Assessment and plan COVID-22 47 year old female presents to the emergency department with cough, body aches and fever.  Vital signs were reassuring at triage.  On exam, patient was alert and nontoxic-appearing.  Patient tested positive for COVID-19.  Will recommend rest and hydration at home as well as Tylenol and ibuprofen alternating.  Will prescribe patient Zofran for nausea and Tessalon Perles for cough.  Return precautions were given to return with new or worsening symptoms.     FINAL CLINICAL IMPRESSION(S) / ED DIAGNOSES   Final diagnoses:  Viral illness     Rx / DC Orders   ED Discharge Orders          Ordered    ondansetron (ZOFRAN-ODT) 4 MG disintegrating tablet  Every 8 hours PRN        08/25/22 2045    benzonatate (TESSALON PERLES) 100 MG capsule  3 times daily PRN        08/25/22 2045             Note:  This document was prepared using Dragon voice  recognition software and may include unintentional dictation errors.   Pia Mau [place], PA-C 08/25/22 2316    Dionne Bucy, MD 08/26/22 1524

## 2022-08-25 NOTE — ED Triage Notes (Signed)
Pt to ed from home via POV for cold, congestion, cough and "multiple sneezing fits".  Pt is caox4, in no acute distress and ambulatory in triage.

## 2022-09-11 ENCOUNTER — Encounter: Payer: Self-pay | Admitting: Emergency Medicine

## 2022-09-11 ENCOUNTER — Emergency Department
Admission: EM | Admit: 2022-09-11 | Discharge: 2022-09-11 | Disposition: A | Discharge status: Home / Self Care | Payer: Federal, State, Local not specified - PPO | Attending: Emergency Medicine | Admitting: Emergency Medicine

## 2022-09-11 ENCOUNTER — Other Ambulatory Visit: Payer: Self-pay

## 2022-09-11 ENCOUNTER — Emergency Department: Payer: Federal, State, Local not specified - PPO

## 2022-09-11 DIAGNOSIS — R519 Headache, unspecified: Secondary | ICD-10-CM | POA: Diagnosis present

## 2022-09-11 DIAGNOSIS — Z8616 Personal history of COVID-19: Secondary | ICD-10-CM | POA: Insufficient documentation

## 2022-09-11 LAB — COMPREHENSIVE METABOLIC PANEL
ALT: 21 U/L (ref 0–44)
AST: 18 U/L (ref 15–41)
Albumin: 3.7 g/dL (ref 3.5–5.0)
Alkaline Phosphatase: 78 U/L (ref 38–126)
Anion gap: 8 (ref 5–15)
BUN: 9 mg/dL (ref 6–20)
CO2: 23 mmol/L (ref 22–32)
Calcium: 8.5 mg/dL — ABNORMAL LOW (ref 8.9–10.3)
Chloride: 101 mmol/L (ref 98–111)
Creatinine, Ser: 0.63 mg/dL (ref 0.44–1.00)
GFR, Estimated: >60 mL/min (ref >60)
Glucose, Bld: 85 mg/dL (ref 70–99)
Potassium: 3.9 mmol/L (ref 3.5–5.1)
Sodium: 132 mmol/L — ABNORMAL LOW (ref 135–145)
Total Bilirubin: 0.4 mg/dL (ref 0.3–1.2)
Total Protein: 7.1 g/dL (ref 6.5–8.1)

## 2022-09-11 LAB — CBC
HCT: 42.4 % (ref 36.0–46.0)
Hemoglobin: 14.4 g/dL (ref 12.0–15.0)
MCH: 31 pg (ref 26.0–34.0)
MCHC: 34 g/dL (ref 30.0–36.0)
MCV: 91.4 fL (ref 80.0–100.0)
Platelets: 361 10*3/uL (ref 150–400)
RBC: 4.64 MIL/uL (ref 3.87–5.11)
RDW: 13.2 % (ref 11.5–15.5)
WBC: 12.6 10*3/uL — ABNORMAL HIGH (ref 4.0–10.5)
nRBC: 0 % (ref 0.0–0.2)

## 2022-09-11 MED ORDER — ACETAMINOPHEN 500 MG PO TABS
1000.0000 mg | ORAL_TABLET | Freq: Once | ORAL | Status: AC
  Administered 2022-09-11: 1000 mg via ORAL
  Filled 2022-09-11: qty 2

## 2022-09-11 MED ORDER — SODIUM CHLORIDE 0.9 % IV BOLUS
500.0000 mL | Freq: Once | INTRAVENOUS | Status: AC
  Administered 2022-09-11: 500 mL via INTRAVENOUS

## 2022-09-11 MED ORDER — DROPERIDOL 2.5 MG/ML IJ SOLN
2.5000 mg | Freq: Once | INTRAMUSCULAR | Status: AC
  Administered 2022-09-11: 2.5 mg via INTRAVENOUS
  Filled 2022-09-11: qty 2

## 2022-09-11 MED ORDER — PROCHLORPERAZINE EDISYLATE 10 MG/2ML IJ SOLN
5.0000 mg | Freq: Once | INTRAMUSCULAR | Status: AC
  Administered 2022-09-11: 5 mg via INTRAVENOUS
  Filled 2022-09-11: qty 2

## 2022-09-11 MED ORDER — KETOROLAC TROMETHAMINE 15 MG/ML IJ SOLN
15.0000 mg | Freq: Once | INTRAMUSCULAR | Status: AC
  Administered 2022-09-11: 15 mg via INTRAVENOUS
  Filled 2022-09-11: qty 1

## 2022-09-11 NOTE — Discharge Instructions (Signed)
You were seen in the emergency department for a headache.  You had a CT scan done of your head that did not show any internal bleeding.  Your lab work was overall normal.  You had a normal neurologic exam.  You are given multiple doses of IV medications to treat your headache, this improved her headache however did not resolve your headache.  We discussed getting a CT scan with contrast to evaluate for aneurysms or blood clots, you need to leave before this was completed.  It is important that if your headache continues or worsens that you return to the emergency department for further workup.  Call your primary care physician for close follow-up.  Pain control:  Ibuprofen (motrin/aleve/advil) - You can take 3 tablets (600 mg) every 6 hours as needed for pain/fever.  Acetaminophen (tylenol) - You can take 2 extra strength tablets (1000 mg) every 6 hours as needed for pain/fever.  You can alternate these medications or take them together.  Make sure you eat food/drink water when taking these medications.

## 2022-09-11 NOTE — ED Provider Notes (Signed)
[facility] Provider Note    Event Date/Time   First MD Initiated Contact with Patient 09/11/22 1503     (approximate)   History   Headache   HPI  Marissa Rocha is a 47 y.o. female past medical history significant for migraine headaches, presents to the emergency department with a headache.  Patient states that she was diagnosed with COVID about 3 weeks ago.  States that over the past 3 days she has been having worsening headache.  States that her headache started 2 days ago and came on all of a sudden.  Nausea and photophobia.  States that this feels different than prior migraine headaches.  Endorses no episodes of vomiting.  No chest pain or shortness of breath.  No falls or trauma.  Not on anticoagulation.     Physical Exam   Triage Vital Signs: ED Triage Vitals [09/11/22 1340]  Encounter Vitals Group     BP (!) 133/94     Systolic BP Percentile      Diastolic BP Percentile      Pulse Rate 83     Resp 17     Temp 98.1 F (36.7 C)     Temp src      SpO2 97 %     Weight 224 lb 13.9 oz (102 kg)     Height 5\' 8"  (1.727 m)     Head Circumference      Peak Flow      Pain Score 8     Pain Loc      Pain Education      Exclude from Growth Chart     Most recent vital signs: Vitals:   09/11/22 1340  BP: (!) 133/94  Pulse: 83  Resp: 17  Temp: 98.1 F (36.7 C)  SpO2: 97%    Physical Exam Constitutional:      Appearance: She is well-developed.  HENT:     Head: Atraumatic.  Eyes:     Conjunctiva/sclera: Conjunctivae normal.  Cardiovascular:     Rate and Rhythm: Regular rhythm.  Pulmonary:     Effort: No respiratory distress.  Abdominal:     General: There is no distension.  Musculoskeletal:        General: Normal range of motion.     Cervical back: Normal range of motion.  Skin:    General: Skin is warm.  Neurological:     Mental Status: She is alert. Mental status is at baseline.     GCS: GCS eye subscore is 4. GCS verbal subscore  is 5. GCS motor subscore is 6.     Cranial Nerves: Cranial nerves 2-12 are intact.     Sensory: Sensation is intact.     Motor: Motor function is intact.     Coordination: Coordination is intact.     Gait: Gait is intact.     Comments: Photophobia      IMPRESSION / MDM / ASSESSMENT AND PLAN / ED COURSE  I reviewed the triage vital signs and the nursing notes.  Differential diagnosis including migraine headache, tension headache, intracranial hemorrhage, subarachnoid hemorrhage, cerebral venous thrombosis.  Have a low suspicion for meningitis, no meningismus on exam, no fever, altered mental status.    RADIOLOGY I independently reviewed imaging, my interpretation of imaging: CT scan of the head with no signs of intracranial hemorrhage or infarction.  Read as no acute findings.   Labs (all labs ordered are listed, but only abnormal results are displayed) Labs  interpreted as -    Labs Reviewed  CBC - Abnormal; Notable for the following components:      Result Value   WBC 12.6 (*)    All other components within normal limits  COMPREHENSIVE METABOLIC PANEL - Abnormal; Notable for the following components:   Sodium 132 (*)    Calcium 8.5 (*)    All other components within normal limits    Lab work with mild leukocytosis and mild hyponatremia but otherwise no significant electrolyte abnormalities.  Treated with migraine cocktail  On reevaluation had significant improvement of her headache but ongoing pain and rates it as a 5/10.  Ordered IV droperidol and CTA to evaluate for possible SAH or cerebral venous thrombosis.  Called into the room by patient's nurse and told that she needed to leave.  Discussed at length with the patient that we had not yet ruled out subarachnoid hemorrhage or cerebral venous thrombosis and other significant etiologies that could be causing her headache.  States that she is feeling much better and that if her headache did not improve that she would return  to complete a further workup.  Discussed close follow-up with primary care physician.     PROCEDURES:  Critical Care performed: No  Procedures  Patient's presentation is most consistent with acute presentation with potential threat to life or bodily function.   MEDICATIONS ORDERED IN ED: Medications  sodium chloride 0.9 % bolus 500 mL (0 mLs Intravenous Stopped 09/11/22 1814)  ketorolac (TORADOL) 15 MG/ML injection 15 mg (15 mg Intravenous Given 09/11/22 1543)  prochlorperazine (COMPAZINE) injection 5 mg (5 mg Intravenous Given 09/11/22 1551)  droperidol (INAPSINE) 2.5 MG/ML injection 2.5 mg (2.5 mg Intravenous Given 09/11/22 1724)  acetaminophen (TYLENOL) tablet 1,000 mg (1,000 mg Oral Given 09/11/22 1723)    FINAL CLINICAL IMPRESSION(S) / ED DIAGNOSES   Final diagnoses:  Headache disorder     Rx / DC Orders   ED Discharge Orders     None        Note:  This document was prepared using Dragon voice recognition software and may include unintentional dictation errors.   Corena Herter, MD 09/12/22 0005

## 2022-09-11 NOTE — ED Triage Notes (Signed)
Pt here with a headache. Pt is also recovering from Covid. Pt states she has tried Tylenol and Motrin with no relief. Pt denies fall or injury.

## 2022-09-11 NOTE — ED Notes (Signed)
See triage note  Presents with headache for the past several days  Was dx'd with COVID about 3 weeks ago   States over the past 3 days  Pain is at top of head  Slight nausea

## 2023-02-10 ENCOUNTER — Other Ambulatory Visit: Payer: Self-pay

## 2023-02-10 ENCOUNTER — Emergency Department: Payer: Federal, State, Local not specified - PPO

## 2023-02-10 ENCOUNTER — Emergency Department
Admission: EM | Admit: 2023-02-10 | Discharge: 2023-02-10 | Disposition: A | Discharge status: Home / Self Care | Payer: Federal, State, Local not specified - PPO | Attending: Emergency Medicine | Admitting: Emergency Medicine

## 2023-02-10 DIAGNOSIS — R079 Chest pain, unspecified: Secondary | ICD-10-CM

## 2023-02-10 DIAGNOSIS — R110 Nausea: Secondary | ICD-10-CM | POA: Insufficient documentation

## 2023-02-10 LAB — BASIC METABOLIC PANEL
Anion gap: 13 (ref 5–15)
BUN: 11 mg/dL (ref 6–20)
CO2: 21 mmol/L — ABNORMAL LOW (ref 22–32)
Calcium: 8.7 mg/dL — ABNORMAL LOW (ref 8.9–10.3)
Chloride: 100 mmol/L (ref 98–111)
Creatinine, Ser: 0.66 mg/dL (ref 0.44–1.00)
GFR, Estimated: >60 mL/min (ref >60)
Glucose, Bld: 73 mg/dL (ref 70–99)
Potassium: 3.6 mmol/L (ref 3.5–5.1)
Sodium: 134 mmol/L — ABNORMAL LOW (ref 135–145)

## 2023-02-10 LAB — D-DIMER, QUANTITATIVE: D-Dimer, Quant: 0.83 ug{FEU}/mL — ABNORMAL HIGH (ref 0.00–0.50)

## 2023-02-10 LAB — CBC
HCT: 40.7 % (ref 36.0–46.0)
Hemoglobin: 13.9 g/dL (ref 12.0–15.0)
MCH: 31.7 pg (ref 26.0–34.0)
MCHC: 34.2 g/dL (ref 30.0–36.0)
MCV: 92.7 fL (ref 80.0–100.0)
Platelets: 386 10*3/uL (ref 150–400)
RBC: 4.39 MIL/uL (ref 3.87–5.11)
RDW: 13.5 % (ref 11.5–15.5)
WBC: 20.7 10*3/uL — ABNORMAL HIGH (ref 4.0–10.5)
nRBC: 0 % (ref 0.0–0.2)

## 2023-02-10 LAB — TROPONIN I (HIGH SENSITIVITY)
Troponin I (High Sensitivity): 3 ng/L (ref <18)
Troponin I (High Sensitivity): 5 ng/L (ref <18)

## 2023-02-10 MED ORDER — IOHEXOL 350 MG/ML SOLN
75.0000 mL | Freq: Once | INTRAVENOUS | Status: AC | PRN
  Administered 2023-02-10: 75 mL via INTRAVENOUS

## 2023-02-10 MED ORDER — KETOROLAC TROMETHAMINE 15 MG/ML IJ SOLN
15.0000 mg | Freq: Once | INTRAMUSCULAR | Status: AC
  Administered 2023-02-10: 15 mg via INTRAVENOUS
  Filled 2023-02-10: qty 1

## 2023-02-10 NOTE — Discharge Instructions (Signed)
 Laboratory workup and CT imaging was reassuring today.  Please follow-up with cardiology for outpatient management and further evaluation.  Please return for any severe worsening symptoms.

## 2023-02-10 NOTE — ED Provider Notes (Signed)
 [facility] Provider Note    Event Date/Time   First MD Initiated Contact with Patient 02/10/23 1544     (approximate)   History   Chest Pain and Arm Pain   HPI Marissa Rocha is a 48 y.o. female with history of endometriosis, chronic leukocytosis presenting today for chest pain.  Patient states several hours ago she had onset of dull left-sided chest pain.  Intermittently radiates into her arm and her neck.  Occasionally has difficulty breathing but not constant.  Denies diaphoresis, nausea, vomiting, abdominal pain at the time of the symptoms.  Stated a couple days ago she had some nausea but no true vomiting.  No new abdominal pain different from her baseline with endometriosis.  No prior history of blood clots.  No similar episodes to this outside of 1 slight chest pain episode 5 months ago that quickly resolved.  No prior cardiac history.     Physical Exam   Triage Vital Signs: ED Triage Vitals  Encounter Vitals Group     BP 02/10/23 1454 (!) 135/94     Systolic BP Percentile --      Diastolic BP Percentile --      Pulse Rate 02/10/23 1454 93     Resp 02/10/23 1454 18     Temp 02/10/23 1454 98.9 F (37.2 C)     Temp Source 02/10/23 1454 Oral     SpO2 02/10/23 1454 94 %     Weight 02/10/23 1452 225 lb (102.1 kg)     Height 02/10/23 1452 5' 8 (1.727 m)     Head Circumference --      Peak Flow --      Pain Score 02/10/23 1450 10     Pain Loc --      Pain Education --      Exclude from Growth Chart --     Most recent vital signs: Vitals:   02/10/23 1454  BP: (!) 135/94  Pulse: 93  Resp: 18  Temp: 98.9 F (37.2 C)  SpO2: 94%   Physical Exam: I have reviewed the vital signs and nursing notes. General: Awake, alert, no acute distress.  Nontoxic appearing. Head:  Atraumatic, normocephalic.   ENT:  EOM intact, PERRL. Oral mucosa is pink and moist with no lesions. Neck: Neck is supple with full range of motion, No meningeal  signs. Cardiovascular:  RRR, No murmurs. Peripheral pulses palpable and equal bilaterally. Respiratory:  Symmetrical chest wall expansion.  No rhonchi, rales, or wheezes.  Good air movement throughout.  No use of accessory muscles.   Musculoskeletal:  No cyanosis or edema. Moving extremities with full ROM Abdomen:  Soft, nontender, nondistended. Neuro:  GCS 15, moving all four extremities, interacting appropriately. Speech clear. Psych:  Calm, appropriate.   Skin:  Warm, dry, no rash.    ED Results / Procedures / Treatments   Labs (all labs ordered are listed, but only abnormal results are displayed) Labs Reviewed  BASIC METABOLIC PANEL - Abnormal; Notable for the following components:      Result Value   Sodium 134 (*)    CO2 21 (*)    Calcium 8.7 (*)    All other components within normal limits  CBC - Abnormal; Notable for the following components:   WBC 20.7 (*)    All other components within normal limits  D-DIMER, QUANTITATIVE - Abnormal; Notable for the following components:   D-Dimer, Quant 0.83 (*)    All other components within normal  limits  POC URINE PREG, ED  TROPONIN I (HIGH SENSITIVITY)  TROPONIN I (HIGH SENSITIVITY)     EKG My EKG interpretation: Rate of 96, normal sinus rhythm, normal axis, normal intervals.  No acute ST elevations or depressions   RADIOLOGY Independently interpreted CTA chest with no acute pathology   PROCEDURES:  Critical Care performed: No  Procedures   MEDICATIONS ORDERED IN ED: Medications  ketorolac  (TORADOL ) 15 MG/ML injection 15 mg (15 mg Intravenous Given 02/10/23 1649)  iohexol  (OMNIPAQUE ) 350 MG/ML injection 75 mL (75 mLs Intravenous Contrast Given 02/10/23 1750)     IMPRESSION / MDM / ASSESSMENT AND PLAN / ED COURSE  I reviewed the triage vital signs and the nursing notes.                              Differential diagnosis includes, but is not limited to, ACS, PE, pneumonia, pneumothorax, musculoskeletal strain,  electrolyte abnormality  Patient's presentation is most consistent with acute complicated illness / injury requiring diagnostic workup.  Patient is a 48 year old female presenting today for left-sided chest pain intermittently associated with worsening pain with deep breaths.  Mild radiation to the left shoulder which is not currently present.  Has had some nausea but difficult to correlate with her chest pain symptoms.  Heart score of 2.  EKG unremarkable and troponins negative x 2 with low concern for ACS.  D-dimer was elevated but follow-up CTA chest showed no evidence of PE or other acute lung abnormalities.  CBC and BMP only most notable for leukocytosis at 20.  However, patient has had a recent steroid use which may be contributing to this with a prior history of chronic leukocytosis.  No other obvious infection symptoms to warrant antibiotics at this time.  Patient reassessed and feeling comfortable at this time.  Will discharge with follow-up with cardiology given chest pain symptoms.  The patient is on the cardiac monitor to evaluate for evidence of arrhythmia and/or significant heart rate changes. Clinical Course as of 02/10/23 1815  [place] Feb 10, 2023  1810 CT Angio Chest PE W and/or Wo Contrast No acute intrathoracic pathology [DW]    Clinical Course User Index [DW] Malvina Alm DASEN, MD     FINAL CLINICAL IMPRESSION(S) / ED DIAGNOSES   Final diagnoses:  Chest pain, unspecified type     Rx / DC Orders   ED Discharge Orders          Ordered    Ambulatory referral to Cardiology       Comments: If you have not heard from the Cardiology office within the next 72 hours please call [phone].   02/10/23 1815             Note:  This document was prepared using Dragon voice recognition software and may include unintentional dictation errors.   Malvina Alm DASEN, MD 02/10/23 [phone]

## 2023-02-10 NOTE — ED Triage Notes (Signed)
 Pt to ED for L sided constant dull chest pain with intermittent sharp and stabbing pain that radiates to L jaw and down L arm. This all started around 10am today. Also about 1 hour ago felt flushed and like having a cold sweat. Deep breathing slightly painful (dull). Skin is dry.

## 2023-02-18 ENCOUNTER — Other Ambulatory Visit: Payer: Self-pay

## 2023-02-18 ENCOUNTER — Emergency Department
Admission: EM | Admit: 2023-02-18 | Discharge: 2023-02-19 | Disposition: A | Discharge status: Home / Self Care | Payer: Federal, State, Local not specified - PPO | Attending: Emergency Medicine | Admitting: Emergency Medicine

## 2023-02-18 DIAGNOSIS — R197 Diarrhea, unspecified: Secondary | ICD-10-CM

## 2023-02-18 DIAGNOSIS — K8020 Calculus of gallbladder without cholecystitis without obstruction: Secondary | ICD-10-CM | POA: Insufficient documentation

## 2023-02-18 DIAGNOSIS — N83202 Unspecified ovarian cyst, left side: Secondary | ICD-10-CM | POA: Diagnosis not present

## 2023-02-18 DIAGNOSIS — R1012 Left upper quadrant pain: Secondary | ICD-10-CM | POA: Diagnosis present

## 2023-02-18 LAB — CBC
HCT: 42.1 % (ref 36.0–46.0)
Hemoglobin: 14.4 g/dL (ref 12.0–15.0)
MCH: 31.4 pg (ref 26.0–34.0)
MCHC: 34.2 g/dL (ref 30.0–36.0)
MCV: 91.7 fL (ref 80.0–100.0)
Platelets: 347 10*3/uL (ref 150–400)
RBC: 4.59 MIL/uL (ref 3.87–5.11)
RDW: 13.3 % (ref 11.5–15.5)
WBC: 15.4 10*3/uL — ABNORMAL HIGH (ref 4.0–10.5)
nRBC: 0 % (ref 0.0–0.2)

## 2023-02-18 LAB — COMPREHENSIVE METABOLIC PANEL
ALT: 19 U/L (ref 0–44)
AST: 21 U/L (ref 15–41)
Albumin: 3.8 g/dL (ref 3.5–5.0)
Alkaline Phosphatase: 94 U/L (ref 38–126)
Anion gap: 12 (ref 5–15)
BUN: 9 mg/dL (ref 6–20)
CO2: 23 mmol/L (ref 22–32)
Calcium: 9.5 mg/dL (ref 8.9–10.3)
Chloride: 101 mmol/L (ref 98–111)
Creatinine, Ser: 0.8 mg/dL (ref 0.44–1.00)
GFR, Estimated: >60 mL/min (ref >60)
Glucose, Bld: 117 mg/dL — ABNORMAL HIGH (ref 70–99)
Potassium: 3.9 mmol/L (ref 3.5–5.1)
Sodium: 136 mmol/L (ref 135–145)
Total Bilirubin: 0.5 mg/dL (ref 0.0–1.2)
Total Protein: 7.4 g/dL (ref 6.5–8.1)

## 2023-02-18 LAB — POC URINE PREG, ED: Preg Test, Ur: NEGATIVE

## 2023-02-18 LAB — TROPONIN I (HIGH SENSITIVITY): Troponin I (High Sensitivity): 6 ng/L (ref <18)

## 2023-02-18 LAB — LIPASE, BLOOD: Lipase: 28 U/L (ref 11–51)

## 2023-02-18 MED ORDER — ONDANSETRON 4 MG PO TBDP
4.0000 mg | ORAL_TABLET | Freq: Once | ORAL | Status: AC | PRN
  Administered 2023-02-18: 4 mg via ORAL
  Filled 2023-02-18: qty 1

## 2023-02-18 MED ORDER — ONDANSETRON HCL 4 MG/2ML IJ SOLN
4.0000 mg | Freq: Once | INTRAMUSCULAR | Status: AC
  Administered 2023-02-19: 4 mg via INTRAVENOUS
  Filled 2023-02-18: qty 2

## 2023-02-18 MED ORDER — KETOROLAC TROMETHAMINE 30 MG/ML IJ SOLN
15.0000 mg | Freq: Once | INTRAMUSCULAR | Status: AC
  Administered 2023-02-19: 15 mg via INTRAVENOUS
  Filled 2023-02-18: qty 1

## 2023-02-18 MED ORDER — SODIUM CHLORIDE 0.9 % IV BOLUS
1000.0000 mL | Freq: Once | INTRAVENOUS | Status: AC
  Administered 2023-02-19: 1000 mL via INTRAVENOUS

## 2023-02-18 NOTE — ED Provider Notes (Signed)
 [facility] Provider Note    Event Date/Time   First MD Initiated Contact with Patient 02/18/23 2349     (approximate)   History   Abdominal Pain   HPI {Remember to add pertinent medical, surgical, social, and/or OB history to HPI:1} Marissa Rocha is a 48 y.o. female  ***       Past Medical History  History reviewed. No pertinent past medical history.   Active Problem List  There are no active problems to display for this patient.    Past Surgical History   Past Surgical History:  Procedure Laterality Date  . CESAREAN SECTION     x3     Home Medications   Prior to Admission medications   Not on File     Allergies  Tramadol   Family History  History reviewed. No pertinent family history.   Physical Exam  Triage Vital Signs: ED Triage Vitals  Encounter Vitals Group     BP 02/18/23 1908 133/85     Systolic BP Percentile --      Diastolic BP Percentile --      Pulse Rate 02/18/23 1908 89     Resp 02/18/23 1908 18     Temp 02/18/23 1908 98.4 F (36.9 C)     Temp Source 02/18/23 1908 Oral     SpO2 02/18/23 1908 100 %     Weight 02/18/23 1909 235 lb (106.6 kg)     Height 02/18/23 1909 5\' 8"  (1.727 m)     Head Circumference --      Peak Flow --      Pain Score 02/18/23 1909 8     Pain Loc --      Pain Education --      Exclude from Growth Chart --     Updated Vital Signs: BP (!) 119/97 (BP Location: Left Arm)   Pulse 94   Temp 98.7 F (37.1 C) (Oral)   Resp 18   Ht 5\' 8"  (1.727 m)   Wt 106.6 kg   SpO2 99%   BMI 35.73 kg/m   {Only need to document appropriate and relevant physical exam:1} General: Awake, no distress. *** CV:  Good peripheral perfusion. *** Resp:  Normal effort. *** Abd:  No distention. *** Other:  ***   ED Results / Procedures / Treatments  Labs (all labs ordered are listed, but only abnormal results are displayed) Labs Reviewed  COMPREHENSIVE METABOLIC PANEL - Abnormal; Notable for the  following components:      Result Value   Glucose, Bld 117 (*)    All other components within normal limits  CBC - Abnormal; Notable for the following components:   WBC 15.4 (*)    All other components within normal limits  LIPASE, BLOOD  URINALYSIS, ROUTINE W REFLEX MICROSCOPIC  POC URINE PREG, ED  TROPONIN I (HIGH SENSITIVITY)     EKG  ***   RADIOLOGY *** {You MUST document your own interpretation of imaging, as well as the fact that you reviewed the radiologist's report!:1}  Official radiology report(s): No results found.   PROCEDURES:  Critical Care performed: {CriticalCareYesNo:19197::"Yes, see critical care procedure note(s)","No"}  Procedures   MEDICATIONS ORDERED IN ED: Medications  ondansetron (ZOFRAN-ODT) disintegrating tablet 4 mg (4 mg Oral Given 02/18/23 1915)     IMPRESSION / MDM / ASSESSMENT AND PLAN / ED COURSE  I reviewed the triage vital signs and the nursing notes.  Differential diagnosis includes, but is not limited to, ***  Patient's presentation is most consistent with {EM COPA:27473}  {If the patient is on the monitor, remove the brackets and asterisks on the sentence below and remember to document it as a Procedure as well. Otherwise delete the sentence below:1} {**The patient is on the cardiac monitor to evaluate for evidence of arrhythmia and/or significant heart rate changes.**}  {Remember to include, when applicable, any/all of the following data: independent review of imaging independent review of labs (comment specifically on pertinent positives and negatives) review of specific prior hospitalizations, PCP/specialist notes, etc. discuss meds given and prescribed document any discussion with consultants (including hospitalists) any clinical decision tools you used and why (PECARN, NEXUS, etc.) did you consider admitting the patient? document social determinants of health affecting patient's care  (homelessness, inability to follow up in a timely fashion, etc) document any pre-existing conditions increasing risk on current visit (e.g. diabetes and HTN increasing danger of high-risk chest pain/ACS) describes what meds you gave (especially parenteral) and why any other interventions?:1}      FINAL CLINICAL IMPRESSION(S) / ED DIAGNOSES   Final diagnoses:  Left upper quadrant abdominal pain  Diarrhea, unspecified type     Rx / DC Orders   ED Discharge Orders     None        Note:  This document was prepared using Dragon voice recognition software and may include unintentional dictation errors.

## 2023-02-18 NOTE — ED Triage Notes (Signed)
 Pt to ED via POV c/o LUQ abd pain that radiates to lower left abd. Started a few days ago. Has been having diarrhea on/off for about a month. Endorses some nausea. Denies CP, SHOB, fevers, dizziness

## 2023-02-18 NOTE — ED Notes (Signed)
 ED Provider at bedside.

## 2023-02-19 ENCOUNTER — Emergency Department: Payer: Federal, State, Local not specified - PPO

## 2023-02-19 MED ORDER — ONDANSETRON 4 MG PO TBDP
4.0000 mg | ORAL_TABLET | Freq: Three times a day (TID) | ORAL | 0 refills | Status: AC | PRN
Start: 2023-02-19 — End: ?

## 2023-02-19 MED ORDER — OXYCODONE-ACETAMINOPHEN 5-325 MG PO TABS
1.0000 | ORAL_TABLET | Freq: Once | ORAL | Status: AC
  Administered 2023-02-19: 1 via ORAL
  Filled 2023-02-19: qty 1

## 2023-02-19 MED ORDER — OXYCODONE-ACETAMINOPHEN 5-325 MG PO TABS: 1.0000 | ORAL_TABLET | ORAL | 0 refills | Status: DC | PRN

## 2023-02-19 MED ORDER — IOHEXOL 350 MG/ML SOLN
100.0000 mL | Freq: Once | INTRAVENOUS | Status: AC | PRN
  Administered 2023-02-19: 100 mL via INTRAVENOUS

## 2023-02-19 NOTE — ED Notes (Signed)
 ED Provider at bedside.

## 2023-02-19 NOTE — Discharge Instructions (Signed)
 You may take medicines as needed for pain and nausea.  BRAT diet x 1 week, then slowly advance diet as tolerated.  Return to the ER for worsening symptoms, persistent vomiting, difficulty breathing or other concerns.

## 2023-07-29 ENCOUNTER — Encounter: Payer: Self-pay | Admitting: Emergency Medicine

## 2023-07-29 ENCOUNTER — Other Ambulatory Visit: Payer: Self-pay

## 2023-07-29 ENCOUNTER — Emergency Department
Admission: EM | Admit: 2023-07-29 | Discharge: 2023-07-29 | Disposition: A | Discharge status: Home / Self Care | Attending: Emergency Medicine | Admitting: Emergency Medicine

## 2023-07-29 DIAGNOSIS — W540XXD Bitten by dog, subsequent encounter: Secondary | ICD-10-CM | POA: Insufficient documentation

## 2023-07-29 DIAGNOSIS — S51852D Open bite of left forearm, subsequent encounter: Secondary | ICD-10-CM | POA: Diagnosis present

## 2023-07-29 DIAGNOSIS — Z4802 Encounter for removal of sutures: Secondary | ICD-10-CM | POA: Insufficient documentation

## 2023-07-29 NOTE — Discharge Instructions (Addendum)
 Follow up with [facility], call and ask for hand specialist Keep the area dry, do not use vaseline or neosporin at this time

## 2023-07-29 NOTE — ED Triage Notes (Signed)
 Patient to ED via POV for dog bite. Occurred 1.5 weeks ago. Seen at [facility] for same- placed 1 stitch. States painful and red.

## 2023-07-29 NOTE — ED Provider Notes (Signed)
 [facility] Provider Note    Event Date/Time   First MD Initiated Contact with Patient 07/29/23 1037     (approximate)   History   Animal Bite   HPI  Marissa Rocha is a 48 y.o. female with no significant past medical history presents to the ER with concerns of infected dog bite and needs suture removed.  Had suture placed 1.5 weeks ago at [facility] after initial bite.  Tdap utd, is currently taking augmentin , C/o some numbness and tingling in several fingers of the left hand that is new since the dog bite.  Denies fever/chills/drainage      Physical Exam   Triage Vital Signs: ED Triage Vitals  Encounter Vitals Group     BP 07/29/23 1001 (!) 144/78     Girls Systolic BP Percentile --      Girls Diastolic BP Percentile --      Boys Systolic BP Percentile --      Boys Diastolic BP Percentile --      Pulse Rate 07/29/23 1001 90     Resp 07/29/23 1001 17     Temp 07/29/23 1001 98 F (36.7 C)     Temp Source 07/29/23 1001 Oral     SpO2 07/29/23 1001 100 %     Weight 07/29/23 1000 234 lb (106.1 kg)     Height 07/29/23 1000 5' 8 (1.727 m)     Head Circumference --      Peak Flow --      Pain Score 07/29/23 1000 7     Pain Loc --      Pain Education --      Exclude from Growth Chart --     Most recent vital signs: Vitals:   07/29/23 1001  BP: (!) 144/78  Pulse: 90  Resp: 17  Temp: 98 F (36.7 C)  SpO2: 100%     General: Awake, no distress.   CV:  Good peripheral perfusion.  Resp:  Normal effort.  Abd:  No distention.   Other:  Left forearm with old wound, no redness surrounding the area or drainage, some new tissue at corners and appears to be healing well   ED Results / Procedures / Treatments   Labs (all labs ordered are listed, but only abnormal results are displayed) Labs Reviewed - No data to display   EKG     RADIOLOGY     PROCEDURES:   Suture Removal  Date/Time: 07/29/2023 11:03 AM  Performed by: Gasper Devere ORN, PA-C Authorized by: Gasper Devere ORN, PA-C   Consent:    Consent obtained:  Verbal   Consent given by:  Patient   Risks, benefits, and alternatives were discussed: yes     Risks discussed:  Bleeding, pain and wound separation   Alternatives discussed:  No treatment Universal protocol:    Procedure explained and questions answered to patient or proxy's satisfaction: yes     Immediately prior to procedure, a time out was called: yes     Patient identity confirmed:  Verbally with patient Location:    Location:  Upper extremity   Upper extremity location:  Arm   Arm location:  L lower arm Procedure details:    Wound appearance:  No signs of infection, good wound healing and clean   Number of sutures removed:  1 Post-procedure details:    Post-removal:  Band-Aid applied   Procedure completion:  Tolerated well, no immediate complications    Critical Care:  no Chief Complaint  Patient presents with   Animal Bite      MEDICATIONS ORDERED IN ED: Medications - No data to display   IMPRESSION / MDM / ASSESSMENT AND PLAN / ED COURSE  I reviewed the triage vital signs and the nursing notes.                              Differential diagnosis includes, but is not limited to, wound dehiscence, wound infection, suture removal  Patient's presentation is most consistent with acute illness / injury with system symptoms.   Suture removal, see suture removal procedure.  I did encourage patient to follow-up with a hand specialist due to the numbness and tingling in the fingers secondary to the dog bite.  Do not feel that the area is infected.  she should finish her Augmentin  at this time.  Band-Aid was applied.  Follow-up with her regular doctor as needed.  Return for worsening.  Discharged stable condition.      FINAL CLINICAL IMPRESSION(S) / ED DIAGNOSES   Final diagnoses:  Dog bite, subsequent encounter     Rx / DC Orders   ED Discharge Orders     None        Note:   This document was prepared using Dragon voice recognition software and may include unintentional dictation errors.    Gasper Devere ORN, PA-C 07/29/23 1105    Claudene Rover, MD 07/29/23 [phone]

## 2023-08-02 ENCOUNTER — Other Ambulatory Visit: Payer: Self-pay

## 2023-08-02 DIAGNOSIS — R1012 Left upper quadrant pain: Secondary | ICD-10-CM | POA: Diagnosis not present

## 2023-08-02 DIAGNOSIS — R55 Syncope and collapse: Secondary | ICD-10-CM | POA: Insufficient documentation

## 2023-08-02 DIAGNOSIS — R42 Dizziness and giddiness: Secondary | ICD-10-CM | POA: Insufficient documentation

## 2023-08-02 DIAGNOSIS — R109 Unspecified abdominal pain: Secondary | ICD-10-CM | POA: Diagnosis present

## 2023-08-02 NOTE — ED Triage Notes (Signed)
 Pt states that she had sudden onset of sharp abdominal pain and she states that she next remembers waking up in the floor. Pt has continued to have intermittent abdominal pain  with nausea and vomiting.

## 2023-08-03 ENCOUNTER — Emergency Department

## 2023-08-03 ENCOUNTER — Emergency Department
Admission: EM | Admit: 2023-08-03 | Discharge: 2023-08-03 | Disposition: A | Discharge status: Home / Self Care | Attending: Emergency Medicine | Admitting: Emergency Medicine

## 2023-08-03 DIAGNOSIS — R1012 Left upper quadrant pain: Secondary | ICD-10-CM

## 2023-08-03 LAB — COMPREHENSIVE METABOLIC PANEL WITH GFR
ALT: 23 U/L (ref 0–44)
AST: 22 U/L (ref 15–41)
Albumin: 3.9 g/dL (ref 3.5–5.0)
Alkaline Phosphatase: 104 U/L (ref 38–126)
Anion gap: 8 (ref 5–15)
BUN: 13 mg/dL (ref 6–20)
CO2: 28 mmol/L (ref 22–32)
Calcium: 9.3 mg/dL (ref 8.9–10.3)
Chloride: 99 mmol/L (ref 98–111)
Creatinine, Ser: 0.84 mg/dL (ref 0.44–1.00)
GFR, Estimated: >60 mL/min (ref >60)
Glucose, Bld: 149 mg/dL — ABNORMAL HIGH (ref 70–99)
Potassium: 3.5 mmol/L (ref 3.5–5.1)
Sodium: 135 mmol/L (ref 135–145)
Total Bilirubin: 0.4 mg/dL (ref 0.0–1.2)
Total Protein: 7.9 g/dL (ref 6.5–8.1)

## 2023-08-03 LAB — CBC
HCT: 41.6 % (ref 36.0–46.0)
Hemoglobin: 14 g/dL (ref 12.0–15.0)
MCH: 31.9 pg (ref 26.0–34.0)
MCHC: 33.7 g/dL (ref 30.0–36.0)
MCV: 94.8 fL (ref 80.0–100.0)
Platelets: 398 K/uL (ref 150–400)
RBC: 4.39 MIL/uL (ref 3.87–5.11)
RDW: 13.5 % (ref 11.5–15.5)
WBC: 17.9 K/uL — ABNORMAL HIGH (ref 4.0–10.5)
nRBC: 0 % (ref 0.0–0.2)

## 2023-08-03 LAB — URINALYSIS, ROUTINE W REFLEX MICROSCOPIC
Bilirubin Urine: NEGATIVE
Glucose, UA: NEGATIVE mg/dL
Ketones, ur: NEGATIVE mg/dL
Leukocytes,Ua: NEGATIVE
Nitrite: NEGATIVE
Protein, ur: NEGATIVE mg/dL
Specific Gravity, Urine: 1.026 (ref 1.005–1.030)
pH: 5 (ref 5.0–8.0)

## 2023-08-03 LAB — POC URINE PREG, ED: Preg Test, Ur: NEGATIVE

## 2023-08-03 LAB — LIPASE, BLOOD: Lipase: 33 U/L (ref 11–51)

## 2023-08-03 MED ORDER — DICYCLOMINE HCL 10 MG PO CAPS: 10.0000 mg | ORAL_CAPSULE | Freq: Three times a day (TID) | ORAL | 0 refills | Status: DC

## 2023-08-03 MED ORDER — IOHEXOL 350 MG/ML SOLN
100.0000 mL | Freq: Once | INTRAVENOUS | Status: AC | PRN
  Administered 2023-08-03: 100 mL via INTRAVENOUS

## 2023-08-03 MED ORDER — DICYCLOMINE HCL 10 MG PO CAPS
10.0000 mg | ORAL_CAPSULE | Freq: Once | ORAL | Status: AC
  Administered 2023-08-03: 10 mg via ORAL
  Filled 2023-08-03: qty 1

## 2023-08-03 MED ORDER — FENTANYL CITRATE PF 50 MCG/ML IJ SOSY
50.0000 ug | PREFILLED_SYRINGE | Freq: Once | INTRAMUSCULAR | Status: AC
  Administered 2023-08-03: 50 ug via INTRAVENOUS
  Filled 2023-08-03: qty 1

## 2023-08-03 MED ORDER — KETOROLAC TROMETHAMINE 30 MG/ML IJ SOLN
30.0000 mg | Freq: Once | INTRAMUSCULAR | Status: AC
  Administered 2023-08-03: 30 mg via INTRAVENOUS
  Filled 2023-08-03: qty 1

## 2023-08-03 NOTE — ED Provider Notes (Signed)
 [facility] Provider Note    Event Date/Time   First MD Initiated Contact with Patient 08/03/23 [phone]     (approximate)   History   Abdominal Pain   HPI  Marissa Rocha is a 48 y.o. female who presents with reported abrupt onset left flank pain which she says made her very lightheaded and that she had a syncope episode.  Denies dysuria, does not know of any hematuria.  No fevers or chills.  Pain is now intermittent and not as severe     Physical Exam   Triage Vital Signs: ED Triage Vitals [08/02/23 2357]  Encounter Vitals Group     BP (!) 144/104     Girls Systolic BP Percentile      Girls Diastolic BP Percentile      Boys Systolic BP Percentile      Boys Diastolic BP Percentile      Pulse Rate 91     Resp 16     Temp 98.4 F (36.9 C)     Temp Source Oral     SpO2 100 %     Weight      Height      Head Circumference      Peak Flow      Pain Score 9     Pain Loc      Pain Education      Exclude from Growth Chart     Most recent vital signs: Vitals:   08/02/23 2357  BP: (!) 144/104  Pulse: 91  Resp: 16  Temp: 98.4 F (36.9 C)  SpO2: 100%     General: Awake, no distress.  CV:  Good peripheral perfusion.  Resp:  Normal effort.  Abd:  No distention.  Mild left CVA tenderness Other:     ED Results / Procedures / Treatments   Labs (all labs ordered are listed, but only abnormal results are displayed) Labs Reviewed  COMPREHENSIVE METABOLIC PANEL WITH GFR - Abnormal; Notable for the following components:      Result Value   Glucose, Bld 149 (*)    All other components within normal limits  CBC - Abnormal; Notable for the following components:   WBC 17.9 (*)    All other components within normal limits  URINALYSIS, ROUTINE W REFLEX MICROSCOPIC - Abnormal; Notable for the following components:   Color, Urine YELLOW (*)    APPearance CLEAR (*)    Hgb urine dipstick MODERATE (*)    Bacteria, UA RARE (*)    All other components  within normal limits  LIPASE, BLOOD  POC URINE PREG, ED     EKG     RADIOLOGY CT abdomen pelvis    PROCEDURES:  Critical Care performed:   Procedures   MEDICATIONS ORDERED IN ED: Medications  dicyclomine  (BENTYL ) capsule 10 mg (has no administration in time range)  ketorolac  (TORADOL ) 30 MG/ML injection 30 mg (30 mg Intravenous Given 08/03/23 0135)  fentaNYL  (SUBLIMAZE ) injection 50 mcg (50 mcg Intravenous Given 08/03/23 0308)  iohexol  (OMNIPAQUE ) 350 MG/ML injection 100 mL (100 mLs Intravenous Contrast Given 08/03/23 0325)     IMPRESSION / MDM / ASSESSMENT AND PLAN / ED COURSE  I reviewed the triage vital signs and the nursing notes. Patient's presentation is most consistent with acute presentation with potential threat to life or bodily function.  Patient presents with severe left-sided abdominal pain as detailed above, given rapid onset suspicious for ureterolithiasis, positive hemoglobin on urinalysis, not consistent with infection or  pyelonephritis.  IV Toradol , send for CT scan  Lab work reviewed and is overall quite reassuring  Patient requested more pain medication, IV fentanyl  ordered  CTs and without acute abnormality, patient feeling improved after treatment, no indication for admission at this time, will refer her to GI for further evaluation, Rx for p.o. Bentyl       FINAL CLINICAL IMPRESSION(S) / ED DIAGNOSES   Final diagnoses:  Left upper quadrant abdominal pain     Rx / DC Orders   ED Discharge Orders          Ordered    dicyclomine  (BENTYL ) 10 MG capsule  3 times daily before meals & bedtime        08/03/23 0406             Note:  This document was prepared using Dragon voice recognition software and may include unintentional dictation errors.   Arlander Charleston, MD 08/03/23 [phone]

## 2023-08-03 NOTE — ED Notes (Addendum)
Pt states she is still having abd pain

## 2023-08-07 LAB — POC URINE PREG, ED: Preg Test, Ur: NEGATIVE

## 2023-10-07 ENCOUNTER — Other Ambulatory Visit: Payer: Self-pay

## 2023-10-07 DIAGNOSIS — N3000 Acute cystitis without hematuria: Secondary | ICD-10-CM | POA: Insufficient documentation

## 2023-10-07 DIAGNOSIS — R300 Dysuria: Secondary | ICD-10-CM | POA: Diagnosis present

## 2023-10-07 LAB — PREGNANCY, URINE: Preg Test, Ur: NEGATIVE

## 2023-10-07 LAB — URINALYSIS, ROUTINE W REFLEX MICROSCOPIC
Bilirubin Urine: NEGATIVE
Glucose, UA: NEGATIVE mg/dL
Ketones, ur: NEGATIVE mg/dL
Leukocytes,Ua: NEGATIVE
Nitrite: NEGATIVE
Protein, ur: NEGATIVE mg/dL
Specific Gravity, Urine: 1.003 — ABNORMAL LOW (ref 1.005–1.030)
pH: 5 (ref 5.0–8.0)

## 2023-10-07 NOTE — ED Triage Notes (Signed)
 Patient ambulatory to triage with complaints of dysuria. Patient states she has been recently diagnosed with interstitial cystitis. Other than pain associated with urination she states her bladder feels like it hurts all the time, burning at urethra, and feels like she has to push really hard to pee/ get anything out.

## 2023-10-08 ENCOUNTER — Emergency Department

## 2023-10-08 ENCOUNTER — Emergency Department
Admission: EM | Admit: 2023-10-08 | Discharge: 2023-10-08 | Disposition: A | Discharge status: Home / Self Care | Attending: Emergency Medicine | Admitting: Emergency Medicine

## 2023-10-08 DIAGNOSIS — N3000 Acute cystitis without hematuria: Secondary | ICD-10-CM

## 2023-10-08 MED ORDER — FOSFOMYCIN TROMETHAMINE 3 G PO PACK
3.0000 g | PACK | Freq: Once | ORAL | Status: AC
  Administered 2023-10-08: 3 g via ORAL
  Filled 2023-10-08: qty 3

## 2023-10-08 NOTE — Discharge Instructions (Addendum)
 Your CT scan does not show kidney stones fortunately.    Follow-up with your urologist for further testing as planned.  Take acetaminophen  650 mg and ibuprofen  400 mg every 6 hours for pain.  Take with food. Thank you for choosing us  for your health care today!  Please see your primary doctor this week for a follow up appointment.   If you have any new, worsening, or unexpected symptoms call your doctor right away or come back to the emergency department for reevaluation.  It was my pleasure to care for you today.   Ginnie EDISON Cyrena, MD

## 2023-10-08 NOTE — ED Provider Notes (Signed)
 [facility] Provider Note    Event Date/Time   First MD Initiated Contact with Patient 10/08/23 0157     (approximate)   History   Dysuria   HPI  Marissa Rocha is a 48 y.o. female   Past medical history of chronic pelvic pain being worked up by urology with upcoming cystoscopy with going diagnosis of interstitial cystitis, history of kidney stones, presents to Emergency Department with worsening dysuria and suprapubic discomfort.  She has intermittent back spasms.  None currently.  No fevers no chills.   External Medical Documents Reviewed: Previous gynecology and urology note      Physical Exam   Triage Vital Signs: ED Triage Vitals  Encounter Vitals Group     BP 10/07/23 2206 (!) 137/94     Girls Systolic BP Percentile --      Girls Diastolic BP Percentile --      Boys Systolic BP Percentile --      Boys Diastolic BP Percentile --      Pulse Rate 10/07/23 2206 82     Resp 10/07/23 2206 18     Temp 10/07/23 2206 98.8 F (37.1 C)     Temp Source 10/07/23 2206 Oral     SpO2 10/07/23 2206 94 %     Weight 10/07/23 2207 235 lb (106.6 kg)     Height 10/07/23 2207 5' 8 (1.727 m)     Head Circumference --      Peak Flow --      Pain Score 10/07/23 2207 6     Pain Loc --      Pain Education --      Exclude from Growth Chart --     Most recent vital signs: Vitals:   10/07/23 2206  BP: (!) 137/94  Pulse: 82  Resp: 18  Temp: 98.8 F (37.1 C)  SpO2: 94%    General: Awake, no distress.  CV:  Good peripheral perfusion.  Resp:  Normal effort.  Abd:  No distention.  Other:  Awake alert comfortable appearing no acute distress.  Normal vital signs afebrile.  No CVA tenderness.  Soft benign abdominal exam deep palpation all quadrants.   ED Results / Procedures / Treatments   Labs (all labs ordered are listed, but only abnormal results are displayed) Labs Reviewed  URINALYSIS, ROUTINE W REFLEX MICROSCOPIC - Abnormal; Notable for the  following components:      Result Value   Color, Urine STRAW (*)    APPearance CLEAR (*)    Specific Gravity, Urine 1.003 (*)    Hgb urine dipstick SMALL (*)    Bacteria, UA MANY (*)    All other components within normal limits  URINE CULTURE  PREGNANCY, URINE     I ordered and reviewed the above labs they are notable for bacteriuria without inflammatory changes     RADIOLOGY I independently reviewed and interpreted CT scan of the abdomen pelvis and see no obvious inflammatory / obstructive changes I also reviewed radiologist's formal read.   PROCEDURES:  Critical Care performed: No  Procedures   MEDICATIONS ORDERED IN ED: Medications - No data to display   IMPRESSION / MDM / ASSESSMENT AND PLAN / ED COURSE  I reviewed the triage vital signs and the nursing notes.                                Patient's presentation is most consistent  with acute presentation with potential threat to life or bodily function.  Differential diagnosis includes, but is not limited to, urinary tract infection, pyelonephritis, renal colic, considered but less likely intra-abdominal emergencies like infection obstruction perforation   The patient is on the cardiac monitor to evaluate for evidence of arrhythmia and/or significant heart rate changes.  MDM:   Acute on chronic pelvic pain consistent with her going diagnosis of possibly interstitial cystitis.  Given the worsening over the last couple days check urinalysis which shows extensive bacteria but no significant inflammatory changes.  Sent for culture.  Given worsening symptoms can treat empirically for lower urinary tract infection.  History of kidney stones with intermittent back spasms no CVA tenderness not significant pain, I think less likely represents kidney stone but with suspected urine infection, I think prudent to check for a CT scan for concomitant stone.        FINAL CLINICAL IMPRESSION(S) / ED DIAGNOSES   Final  diagnoses:  Acute cystitis without hematuria     Rx / DC Orders   ED Discharge Orders     None        Note:  This document was prepared using Dragon voice recognition software and may include unintentional dictation errors.    Cyrena Mylar, MD 10/08/23 [phone]

## 2023-10-09 LAB — URINE CULTURE: Culture: <10000 — AB

## 2023-10-11 NOTE — ED Notes (Signed)
 10/11/23   1030am   [name] [phone] had called yesterday regarding verifying patients work excuse.  I explained that I cannot do this without patient permission.  I called [name] yesterday.  She had called back last night, but I was gone.  I called her this morning, and she agrees to have me verify the work note.  I explained that I would not discuss any other aspects of her visit.  I called [name] back and left her a message.

## 2023-10-31 ENCOUNTER — Other Ambulatory Visit: Payer: Self-pay

## 2023-10-31 ENCOUNTER — Encounter: Payer: Self-pay | Admitting: Emergency Medicine

## 2023-10-31 ENCOUNTER — Emergency Department
Admission: EM | Admit: 2023-10-31 | Discharge: 2023-11-01 | Disposition: A | Discharge status: Home / Self Care | Attending: Emergency Medicine | Admitting: Emergency Medicine

## 2023-10-31 DIAGNOSIS — R102 Pelvic and perineal pain unspecified side: Secondary | ICD-10-CM | POA: Insufficient documentation

## 2023-10-31 DIAGNOSIS — D72829 Elevated white blood cell count, unspecified: Secondary | ICD-10-CM | POA: Diagnosis not present

## 2023-10-31 DIAGNOSIS — R109 Unspecified abdominal pain: Secondary | ICD-10-CM | POA: Diagnosis present

## 2023-10-31 HISTORY — DX: Endometriosis, unspecified: N80.9

## 2023-10-31 HISTORY — DX: Polycystic ovarian syndrome: E28.2

## 2023-10-31 HISTORY — DX: Cystitis, unspecified without hematuria: N30.90

## 2023-10-31 LAB — URINALYSIS, ROUTINE W REFLEX MICROSCOPIC
Bilirubin Urine: NEGATIVE
Glucose, UA: NEGATIVE mg/dL
Ketones, ur: 5 mg/dL — AB
Leukocytes,Ua: NEGATIVE
Nitrite: NEGATIVE
Protein, ur: NEGATIVE mg/dL
Specific Gravity, Urine: 1.023 (ref 1.005–1.030)
pH: 6 (ref 5.0–8.0)

## 2023-10-31 LAB — COMPREHENSIVE METABOLIC PANEL WITH GFR
ALT: 32 U/L (ref 0–44)
AST: 26 U/L (ref 15–41)
Albumin: 3.9 g/dL (ref 3.5–5.0)
Alkaline Phosphatase: 91 U/L (ref 38–126)
Anion gap: 12 (ref 5–15)
BUN: 10 mg/dL (ref 6–20)
CO2: 22 mmol/L (ref 22–32)
Calcium: 9.2 mg/dL (ref 8.9–10.3)
Chloride: 102 mmol/L (ref 98–111)
Creatinine, Ser: 0.76 mg/dL (ref 0.44–1.00)
GFR, Estimated: >60 mL/min (ref >60)
Glucose, Bld: 113 mg/dL — ABNORMAL HIGH (ref 70–99)
Potassium: 4 mmol/L (ref 3.5–5.1)
Sodium: 136 mmol/L (ref 135–145)
Total Bilirubin: 0.4 mg/dL (ref 0.0–1.2)
Total Protein: 8.4 g/dL — ABNORMAL HIGH (ref 6.5–8.1)

## 2023-10-31 LAB — CBC
HCT: 43.1 % (ref 36.0–46.0)
Hemoglobin: 14.7 g/dL (ref 12.0–15.0)
MCH: 31.5 pg (ref 26.0–34.0)
MCHC: 34.1 g/dL (ref 30.0–36.0)
MCV: 92.3 fL (ref 80.0–100.0)
Platelets: 373 K/uL (ref 150–400)
RBC: 4.67 MIL/uL (ref 3.87–5.11)
RDW: 13.7 % (ref 11.5–15.5)
WBC: 14.2 K/uL — ABNORMAL HIGH (ref 4.0–10.5)
nRBC: 0 % (ref 0.0–0.2)

## 2023-10-31 LAB — POC URINE PREG, ED: Preg Test, Ur: NEGATIVE

## 2023-10-31 LAB — LIPASE, BLOOD: Lipase: 29 U/L (ref 11–51)

## 2023-10-31 NOTE — ED Triage Notes (Signed)
 Pt arrives POV, ambulatory to triage, no acute distress noted c/o lower abd pain that started last night; hx of same. Pt reports difficulty urinating. Due for cystoscopy in near future.  Pt has taken tylenol , ibuprofen , gabapentin, and vaginal valium today, no relief.

## 2023-11-01 ENCOUNTER — Emergency Department

## 2023-11-01 LAB — CHLAMYDIA/NGC RT PCR (ARMC ONLY)
Chlamydia Tr: NOT DETECTED
N gonorrhoeae: NOT DETECTED

## 2023-11-01 MED ORDER — KETOROLAC TROMETHAMINE 30 MG/ML IJ SOLN
30.0000 mg | Freq: Once | INTRAMUSCULAR | Status: AC
  Administered 2023-11-01: 30 mg via INTRAMUSCULAR
  Filled 2023-11-01: qty 1

## 2023-11-01 MED ORDER — ACETAMINOPHEN 500 MG PO TABS
1000.0000 mg | ORAL_TABLET | Freq: Once | ORAL | Status: AC
  Administered 2023-11-01: 1000 mg via ORAL
  Filled 2023-11-01: qty 2

## 2023-11-01 NOTE — ED Provider Notes (Signed)
 [facility] Provider Note    Event Date/Time   First MD Initiated Contact with Patient 11/01/23 0010     (approximate)   History   Abdominal Pain   HPI  Marissa Rocha is a 48 y.o. female   Past medical history of chronic pelvic pain, kidney stones, being followed by urology and gynecology for her chronic pelvic pain, here with ongoing pain.  Unchanged in quality, now slightly moving to the left lower quadrant side, without associated dysuria, frequency, vaginal discharge or bleeding.    Has not been sexually active in quite some time.  No vaginal discharge.  Would like STI test and to check her ovaries given her previous diagnosis of PCOS in the past.    She is starting on naltrexone therapy and would like to avoid narcotics today because she was told it may hinder her ability to start that treatment option.    She has upcoming follow-up appointments with gynecology and urology with a plan for cystoscopy soon.    She also has a bladder spasm medication and she also has a vaginal Valium, baclofen, gabapentin medications.  No other acute medical complaints.  Specifically denies changes in bowel movements, GI bleeding, diarrhea, vomiting    External Medical Documents Reviewed: Previous gynecology note      Physical Exam   Triage Vital Signs: ED Triage Vitals  Encounter Vitals Group     BP 10/31/23 2101 (!) 129/90     Girls Systolic BP Percentile --      Girls Diastolic BP Percentile --      Boys Systolic BP Percentile --      Boys Diastolic BP Percentile --      Pulse Rate 10/31/23 2101 88     Resp 10/31/23 2101 20     Temp 10/31/23 2101 98.9 F (37.2 C)     Temp Source 10/31/23 2101 Oral     SpO2 10/31/23 2101 97 %     Weight 10/31/23 2101 235 lb (106.6 kg)     Height 10/31/23 2101 5' 8 (1.727 m)     Head Circumference --      Peak Flow --      Pain Score 10/31/23 2106 9     Pain Loc --      Pain Education --      Exclude from Growth  Chart --     Most recent vital signs: Vitals:   11/01/23 0045 11/01/23 0100  BP:  120/76  Pulse: 90 92  Resp: 14 16  Temp:    SpO2: 99% 99%    General: Awake, no distress.  CV:  Good peripheral perfusion.  Resp:  Normal effort.  Abd:  No distention.  Other:  Pleasant woman in no acute distress with a relatively benign abdominal exam, mild tenderness to the suprapubic and left lower quadrant area.   ED Results / Procedures / Treatments   Labs (all labs ordered are listed, but only abnormal results are displayed) Labs Reviewed  COMPREHENSIVE METABOLIC PANEL WITH GFR - Abnormal; Notable for the following components:      Result Value   Glucose, Bld 113 (*)    Total Protein 8.4 (*)    All other components within normal limits  CBC - Abnormal; Notable for the following components:   WBC 14.2 (*)    All other components within normal limits  URINALYSIS, ROUTINE W REFLEX MICROSCOPIC - Abnormal; Notable for the following components:   Color, Urine YELLOW (*)  APPearance CLEAR (*)    Hgb urine dipstick SMALL (*)    Ketones, ur 5 (*)    Bacteria, UA RARE (*)    All other components within normal limits  CHLAMYDIA/NGC RT PCR ([facility] ONLY)            LIPASE, BLOOD  POC URINE PREG, ED     I ordered and reviewed the above labs they are notable for cell counts and electrolytes largely unremarkable, mild leukocytosis with a history of chronic leukocytosis    RADIOLOGY I independently reviewed and interpreted ultrasound of the pelvis and see no obvious intrauterine masses or fluid collection I also reviewed radiologist's formal read.   PROCEDURES:  Critical Care performed: No  Procedures   MEDICATIONS ORDERED IN ED: Medications  acetaminophen  (TYLENOL ) tablet 1,000 mg (1,000 mg Oral Given 11/01/23 0047)  ketorolac  (TORADOL ) 30 MG/ML injection 30 mg (30 mg Intramuscular Given 11/01/23 0047)   IMPRESSION / MDM / ASSESSMENT AND PLAN / ED COURSE  I reviewed the triage  vital signs and the nursing notes.                                Patient's presentation is most consistent with acute presentation with potential threat to life or bodily function.  Differential diagnosis includes, but is not limited to, sexually-transmitted infection, pelvic inflammatory disease, urinary tract infection, pregnancy related complications, ovarian cyst, torsion, cystitis, considered but less likely other intra-abdominal infectious or surgical pathologies.   The patient is on the cardiac monitor to evaluate for evidence of arrhythmia and/or significant heart rate changes.  MDM:    Well-appearing young woman with chronic pelvic pain largely unchanged, with a concern for alternative diagnoses such as cysts, torsion, sexually-transmitted infections though we both agree less likely.  Transvaginal ultrasound unremarkable.  She has follow-up with urology and gynecology.  With the chronicity of symptoms and a largely benign abdominal exam I doubt intra-abdominal infection or other surgical pathologies.  She is very low clinical risk factors for sexually transmitted infections, has had pelvic exam performed by gynecology while symptomatic with the same symptoms and would like to defer pelvic exam today.  Will skip empiric treatment for STIs instead send a self swab and she will follow-up with her gynecologist for the results.  I considered hospitalization for admission or observation however given no indication for hospitalization tonight, and adequate follow-up, she was discharged in stable condition with outpatient follow-up.        FINAL CLINICAL IMPRESSION(S) / ED DIAGNOSES   Final diagnoses:  Pelvic pain     Rx / DC Orders   ED Discharge Orders     None        Note:  This document was prepared using Dragon voice recognition software and may include unintentional dictation errors.    Cyrena Mylar, MD 11/01/23 [phone]

## 2023-11-01 NOTE — Discharge Instructions (Signed)
 Continue taking all medications as prescribed.  Check your MyChart for the results of your gonorrhea/chlamydia testing.  Follow-up with your gynecologist and urologist as scheduled.  Thank you for choosing us  for your health care today!  Please see your primary doctor this week for a follow up appointment.   If you have any new, worsening, or unexpected symptoms call your doctor right away or come back to the emergency department for reevaluation.  It was my pleasure to care for you today.   Ginnie EDISON Cyrena, MD

## 2023-11-26 ENCOUNTER — Other Ambulatory Visit: Payer: Self-pay

## 2023-11-26 ENCOUNTER — Emergency Department

## 2023-11-26 ENCOUNTER — Observation Stay
Admission: EM | Admit: 2023-11-26 | Discharge: 2023-11-27 | Disposition: A | Attending: Emergency Medicine | Admitting: Emergency Medicine

## 2023-11-26 DIAGNOSIS — F329 Major depressive disorder, single episode, unspecified: Secondary | ICD-10-CM | POA: Insufficient documentation

## 2023-11-26 DIAGNOSIS — R102 Pelvic and perineal pain unspecified side: Secondary | ICD-10-CM | POA: Insufficient documentation

## 2023-11-26 DIAGNOSIS — Z6835 Body mass index (BMI) 35.0-35.9, adult: Secondary | ICD-10-CM | POA: Diagnosis not present

## 2023-11-26 DIAGNOSIS — E66812 Obesity, class 2: Secondary | ICD-10-CM | POA: Insufficient documentation

## 2023-11-26 DIAGNOSIS — F411 Generalized anxiety disorder: Secondary | ICD-10-CM | POA: Diagnosis not present

## 2023-11-26 DIAGNOSIS — R0789 Other chest pain: Secondary | ICD-10-CM

## 2023-11-26 DIAGNOSIS — G8929 Other chronic pain: Secondary | ICD-10-CM | POA: Insufficient documentation

## 2023-11-26 DIAGNOSIS — G4733 Obstructive sleep apnea (adult) (pediatric): Secondary | ICD-10-CM | POA: Diagnosis not present

## 2023-11-26 DIAGNOSIS — F321 Major depressive disorder, single episode, moderate: Secondary | ICD-10-CM | POA: Diagnosis not present

## 2023-11-26 DIAGNOSIS — F17210 Nicotine dependence, cigarettes, uncomplicated: Secondary | ICD-10-CM | POA: Diagnosis not present

## 2023-11-26 DIAGNOSIS — L680 Hirsutism: Secondary | ICD-10-CM | POA: Insufficient documentation

## 2023-11-26 DIAGNOSIS — R079 Chest pain, unspecified: Secondary | ICD-10-CM | POA: Diagnosis present

## 2023-11-26 LAB — CBC
HCT: 39.9 % (ref 36.0–46.0)
Hemoglobin: 13.6 g/dL (ref 12.0–15.0)
MCH: 31.3 pg (ref 26.0–34.0)
MCHC: 34.1 g/dL (ref 30.0–36.0)
MCV: 91.9 fL (ref 80.0–100.0)
Platelets: 373 K/uL (ref 150–400)
RBC: 4.34 MIL/uL (ref 3.87–5.11)
RDW: 13.6 % (ref 11.5–15.5)
WBC: 11.7 K/uL — ABNORMAL HIGH (ref 4.0–10.5)
nRBC: 0 % (ref 0.0–0.2)

## 2023-11-26 LAB — BASIC METABOLIC PANEL WITH GFR
Anion gap: 9 (ref 5–15)
BUN: 10 mg/dL (ref 6–20)
CO2: 26 mmol/L (ref 22–32)
Calcium: 9.4 mg/dL (ref 8.9–10.3)
Chloride: 103 mmol/L (ref 98–111)
Creatinine, Ser: 0.76 mg/dL (ref 0.44–1.00)
GFR, Estimated: >60 mL/min (ref >60)
Glucose, Bld: 87 mg/dL (ref 70–99)
Potassium: 4.3 mmol/L (ref 3.5–5.1)
Sodium: 138 mmol/L (ref 135–145)

## 2023-11-26 LAB — MAGNESIUM: Magnesium: 1.7 mg/dL (ref 1.7–2.4)

## 2023-11-26 LAB — POC URINE PREG, ED: Preg Test, Ur: NEGATIVE

## 2023-11-26 LAB — D-DIMER, QUANTITATIVE: D-Dimer, Quant: 1.21 ug{FEU}/mL — ABNORMAL HIGH (ref 0.00–0.50)

## 2023-11-26 LAB — TROPONIN T, HIGH SENSITIVITY
Troponin T High Sensitivity: <15 ng/L (ref 0–19)
Troponin T High Sensitivity: <15 ng/L (ref 0–19)

## 2023-11-26 MED ORDER — NITROGLYCERIN 0.4 MG SL SUBL
0.4000 mg | SUBLINGUAL_TABLET | Freq: Once | SUBLINGUAL | Status: AC
  Administered 2023-11-26: 0.4 mg via SUBLINGUAL
  Filled 2023-11-26: qty 1

## 2023-11-26 MED ORDER — LIDOCAINE VISCOUS HCL 2 % MT SOLN
15.0000 mL | Freq: Once | OROMUCOSAL | Status: AC
  Administered 2023-11-26: 15 mL via ORAL
  Filled 2023-11-26: qty 15

## 2023-11-26 MED ORDER — ACETAMINOPHEN 325 MG PO TABS
650.0000 mg | ORAL_TABLET | Freq: Four times a day (QID) | ORAL | Status: DC | PRN
Start: 2023-11-26 — End: 2023-11-27
  Administered 2023-11-27 (×2): 650 mg via ORAL
  Filled 2023-11-26 (×3): qty 2

## 2023-11-26 MED ORDER — SODIUM CHLORIDE 0.9% FLUSH
3.0000 mL | Freq: Two times a day (BID) | INTRAVENOUS | Status: DC
  Administered 2023-11-26 – 2023-11-27 (×2): 3 mL via INTRAVENOUS

## 2023-11-26 MED ORDER — NITROGLYCERIN 0.4 MG SL SUBL
0.4000 mg | SUBLINGUAL_TABLET | SUBLINGUAL | Status: DC | PRN
  Administered 2023-11-26: 0.4 mg via SUBLINGUAL
  Filled 2023-11-26: qty 1

## 2023-11-26 MED ORDER — IOHEXOL 350 MG/ML SOLN
75.0000 mL | Freq: Once | INTRAVENOUS | Status: AC | PRN
  Administered 2023-11-26: 75 mL via INTRAVENOUS

## 2023-11-26 MED ORDER — HYDROMORPHONE HCL 1 MG/ML IJ SOLN
0.2500 mg | INTRAMUSCULAR | Status: DC | PRN
  Filled 2023-11-26: qty 0.5

## 2023-11-26 MED ORDER — ALUM & MAG HYDROXIDE-SIMETH 200-200-20 MG/5ML PO SUSP
30.0000 mL | Freq: Once | ORAL | Status: AC
  Administered 2023-11-26: 30 mL via ORAL
  Filled 2023-11-26: qty 30

## 2023-11-26 MED ORDER — HYDROMORPHONE HCL 1 MG/ML IJ SOLN
0.5000 mg | Freq: Once | INTRAMUSCULAR | Status: AC
  Administered 2023-11-26: 0.5 mg via INTRAVENOUS
  Filled 2023-11-26: qty 0.5

## 2023-11-26 MED ORDER — HEPARIN SODIUM (PORCINE) 5000 UNIT/ML IJ SOLN
5000.0000 [IU] | Freq: Three times a day (TID) | INTRAMUSCULAR | Status: DC
  Administered 2023-11-26 – 2023-11-27 (×2): 5000 [IU] via SUBCUTANEOUS
  Filled 2023-11-26 (×2): qty 1

## 2023-11-26 MED ORDER — SENNOSIDES-DOCUSATE SODIUM 8.6-50 MG PO TABS: 1.0000 | ORAL_TABLET | Freq: Every evening | ORAL | Status: DC | PRN

## 2023-11-26 MED ORDER — MORPHINE SULFATE (PF) 4 MG/ML IV SOLN
4.0000 mg | Freq: Once | INTRAVENOUS | Status: AC
  Administered 2023-11-26: 4 mg via INTRAVENOUS
  Filled 2023-11-26: qty 1

## 2023-11-26 MED ORDER — ACETAMINOPHEN 325 MG PO TABS
650.0000 mg | ORAL_TABLET | Freq: Once | ORAL | Status: AC
  Administered 2023-11-26: 650 mg via ORAL
  Filled 2023-11-26: qty 2

## 2023-11-26 MED ORDER — KETOROLAC TROMETHAMINE 15 MG/ML IJ SOLN
15.0000 mg | Freq: Once | INTRAMUSCULAR | Status: AC
  Administered 2023-11-26: 15 mg via INTRAVENOUS
  Filled 2023-11-26: qty 1

## 2023-11-26 MED ORDER — ACETAMINOPHEN 650 MG RE SUPP: 650.0000 mg | Freq: Four times a day (QID) | RECTAL | Status: DC | PRN

## 2023-11-26 NOTE — ED Provider Notes (Addendum)
 [facility] Provider Note    Event Date/Time   First MD Initiated Contact with Patient 11/26/23 1314     (approximate)   History   Chest Pain   HPI  Marissa Rocha is a 48 year old female with history of PCOS presenting to the emergency department for evaluation of chest pain.  Shortly after waking up this morning patient noticed onset of pain over the left side of her chest.  Had initially been intermittent, now constant with radiation to her left arm and jaw.  No history of similar.  No personal history of high blood pressure, diabetes.  Received 324 aspirin and 2 sublingual nitroglycerin  with EMS.  Did report some pain improvement with the nitroglycerin .    Physical Exam   Triage Vital Signs: ED Triage Vitals  Encounter Vitals Group     BP 11/26/23 1327 115/82     Girls Systolic BP Percentile --      Girls Diastolic BP Percentile --      Boys Systolic BP Percentile --      Boys Diastolic BP Percentile --      Pulse Rate 11/26/23 1327 78     Resp 11/26/23 1327 17     Temp 11/26/23 1327 97.8 F (36.6 C)     Temp Source 11/26/23 1327 Oral     SpO2 11/26/23 1327 99 %     Weight 11/26/23 1325 234 lb 12.6 oz (106.5 kg)     Height 11/26/23 1325 5' 8 (1.727 m)     Head Circumference --      Peak Flow --      Pain Score 11/26/23 1325 9     Pain Loc --      Pain Education --      Exclude from Growth Chart --     Most recent vital signs: Vitals:   11/26/23 1327  BP: 115/82  Pulse: 78  Resp: 17  Temp: 97.8 F (36.6 C)  SpO2: 99%     General: Awake, interactive  CV:  Good peripheral perfusion Resp:  Unlabored respirations, lungs clear to auscultation Chest wall: Nontender to palpation Abd:  Nondistended.  Neuro:  Symmetric facial movement, fluid speech   ED Results / Procedures / Treatments   Labs (all labs ordered are listed, but only abnormal results are displayed) Labs Reviewed  CBC - Abnormal; Notable for the following components:       Result Value   WBC 11.7 (*)    All other components within normal limits  D-DIMER, QUANTITATIVE - Abnormal; Notable for the following components:   D-Dimer, Quant 1.21 (*)    All other components within normal limits  BASIC METABOLIC PANEL WITH GFR  POC URINE PREG, ED  TROPONIN T, HIGH SENSITIVITY  TROPONIN T, HIGH SENSITIVITY     EKG EKG independently reviewed and interpreted by myself demonstrates:  EKG demonstrates sinus rhythm at a rate of 80, PR 166, QRS 81, QTc 455, no acute ST changes  RADIOLOGY Imaging independently reviewed and interpreted by myself demonstrates:  CXR without focal consolidation  Formal Radiology Read:  [facility] Chest Port 1 View Result Date: 11/26/2023 EXAM: 1 VIEW(S) XRAY OF THE CHEST 11/26/2023 01:43:00 PM COMPARISON: 02/10/2023 CLINICAL HISTORY: Chest pain FINDINGS: LUNGS AND PLEURA: No focal pulmonary opacity. No pleural effusion. No pneumothorax. HEART AND MEDIASTINUM: No acute abnormality of the cardiac and mediastinal silhouettes. BONES AND SOFT TISSUES: No acute osseous abnormality. IMPRESSION: 1. No acute cardiopulmonary process. Electronically signed by: Ryan Salvage  MD 11/26/2023 03:07 PM EST RP Workstation: [id number]    PROCEDURES:  Critical Care performed: No  Procedures   MEDICATIONS ORDERED IN ED: Medications  nitroGLYCERIN  (NITROSTAT ) SL tablet 0.4 mg (0.4 mg Sublingual Given 11/26/23 1359)  morphine  (PF) 4 MG/ML injection 4 mg (4 mg Intravenous Given 11/26/23 1400)  HYDROmorphone  (DILAUDID ) injection 0.5 mg (0.5 mg Intravenous Given 11/26/23 1427)  ketorolac  (TORADOL ) 15 MG/ML injection 15 mg (15 mg Intravenous Given 11/26/23 1456)  iohexol  (OMNIPAQUE ) 350 MG/ML injection 75 mL (75 mLs Intravenous Contrast Given 11/26/23 1504)     IMPRESSION / MDM / ASSESSMENT AND PLAN / ED COURSE  I reviewed the triage vital signs and the nursing notes.  Differential diagnosis includes, but is not limited to, ACS, pneumonia,  pneumothorax, PE, lower suspicion of dissection with normotension, narrow mediastinum on x-Cassie Henkels  Patient's presentation is most consistent with acute presentation with potential threat to life or bodily function.  48 year old female presenting with acute onset of chest pain.  Stable vitals on presentation.  Labs with mild leukocytosis, reassuring BMP, negative initial troponin.  D-dimer elevated at 1.21.  CTA of the chest ordered to further evaluate.  Chest x-Rayonna Heldman unrevealing.  Signed out to oncoming physician at 1500 pending CTA and disposition.      FINAL CLINICAL IMPRESSION(S) / ED DIAGNOSES   Final diagnoses:  Acute chest pain     Rx / DC Orders   ED Discharge Orders     None        Note:  This document was prepared using Dragon voice recognition software and may include unintentional dictation errors.   Levander Slate, MD 11/26/23 [id number]    Levander Slate, MD 11/26/23 [phone]

## 2023-11-26 NOTE — ED Provider Notes (Signed)
.-----------------------------------------   3:42 PM on 11/26/2023 -----------------------------------------  Blood pressure 115/82, pulse 78, temperature 97.8 F (36.6 C), temperature source Oral, resp. rate 17, height 5' 8 (1.727 m), weight 106.5 kg, SpO2 99%.  Assuming care from Dr. Levander.  In short, Marissa Rocha is a 48 y.o. female with a chief complaint of CP.  Refer to the original H&P for additional details.  The current plan of care is to follow-up CT PE study, reassess.  On reassessment patient states that her chest pain on the left side that radiates to her jaw and left arm was improving and suddenly became more intense.  No shortness of breath.  States that she think the nitroglycerin  was helping.  Denies prior history of cardiac issues, has been no longtime smoker for decades, states that she has been smoking more recently.  Has a heart score of 4.  Will give her another dose of nitroglycerin  here, she already received full dose aspirin by EMS.  Will plan to admit her for high risk chest pain.  Consult the hospitalist will admit the patient.  She is admitted.  Clinical Course as of 11/26/23 1748  Tue Nov 26, 2023  1648 CT Angio Chest PE W and/or Wo Contrast IMPRESSION: 1. No definite acute pulmonary embolus identified; assessment of subsegmental vessels is limited by suboptimal opacification. 2. Clear lung fields   [TT]  1648 Independent review of labs, mild leukocytosis, electrolytes are severely deranged, pregnancy test is negative, initial troponin is negative, D-dimer is elevated.  CT PE study was done. [TT]    Clinical Course User Index [TT] Waymond Lorelle Cummins, MD     Medications  nitroGLYCERIN  (NITROSTAT ) SL tablet 0.4 mg (0.4 mg Sublingual Given 11/26/23 1359)  morphine  (PF) 4 MG/ML injection 4 mg (4 mg Intravenous Given 11/26/23 1400)  HYDROmorphone  (DILAUDID ) injection 0.5 mg (0.5 mg Intravenous Given 11/26/23 1427)  ketorolac  (TORADOL ) 15 MG/ML injection 15 mg (15 mg  Intravenous Given 11/26/23 1456)  iohexol  (OMNIPAQUE ) 350 MG/ML injection 75 mL (75 mLs Intravenous Contrast Given 11/26/23 1504)  nitroGLYCERIN  (NITROSTAT ) SL tablet 0.4 mg (0.4 mg Sublingual Given 11/26/23 1654)  acetaminophen  (TYLENOL ) tablet 650 mg (650 mg Oral Given 11/26/23 1711)     ED Discharge Orders     None      Final diagnoses:  Acute chest pain  Chest pain with high risk for cardiac etiology      Waymond Lorelle Cummins, MD 11/26/23 [phone]

## 2023-11-26 NOTE — H&P (Incomplete)
 History and Physical    Hajra Port FMW:[id number] DOB: Feb 25, 1975 DOA: 11/26/2023  DOS: the patient was seen and examined on 11/26/2023  PCP: Trenda Core, MD   Patient coming from: Home  I have personally briefly reviewed patient's old medical records in [facility] and CareEverywhere  HPI:   Marissa Rocha is a 48 y.o. year old female with medical history of OSA, tobacco use disorder, generalized anxiety disorder presenting to the ED with chest pain.   Pt states   On arrival to the ED patient was noted to be HDS stable.  Lab work and imaging obtained.  CBC with mild leukocytosis 11.7, BMP unremarkable, troponin negative x 2, D-dimer slightly elevated and CTA negative for PE or any other acute abnormalities.  Heart score elevated at 4, so [facility] contacted for admission.  Review of Systems: As mentioned in the history of present illness. All other systems reviewed and are negative.   Past Medical History:  Diagnosis Date   Cystitis    Endometriosis    PCOS (polycystic ovarian syndrome)     Past Surgical History:  Procedure Laterality Date   CESAREAN SECTION     x3     Allergies  Allergen Reactions   Tramadol Other (See Comments)    headache    History reviewed. No pertinent family history.  Prior to Admission medications   Medication Sig Start Date End Date Taking? Authorizing Provider  dicyclomine  (BENTYL ) 10 MG capsule Take 1 capsule (10 mg total) by mouth 4 (four) times daily -  before meals and at bedtime. 08/03/23   Arlander Charleston, MD  ondansetron  (ZOFRAN -ODT) 4 MG disintegrating tablet Take 1 tablet (4 mg total) by mouth every 8 (eight) hours as needed for nausea or vomiting. 02/19/23   Sung, Jade J, MD  oxyCODONE -acetaminophen  (PERCOCET/ROXICET) 5-325 MG tablet Take 1 tablet by mouth every 4 (four) hours as needed for severe pain (pain score 7-10). 02/19/23   Robinette Vermell PARAS, MD    Social History:  reports that she has been smoking cigarettes. She has never  used smokeless tobacco. She reports current alcohol use. She reports that she does not use drugs. Lives with husband Tobacco- Smoking 1/3 ppd.  EtOH- Occasional use Illicit drug use- denies use.  IADLs/ADLs- can perform independently at baseline    Physical Exam: Vitals:   11/26/23 1530 11/26/23 1600 11/26/23 1630 11/26/23 1700  BP: 120/65 119/65 117/65 100/67  Pulse: 66 60 65 92  Resp: 15 14 13  (!) 27  Temp:      TempSrc:      SpO2:      Weight:      Height:         Gen: HENT: CV: Resp: Abd: MSK: Skin: Neuro: Psych:   Labs on Admission: I have personally reviewed following labs and imaging studies  CBC: Recent Labs  Lab 11/26/23 1338  WBC 11.7*  HGB 13.6  HCT 39.9  MCV 91.9  PLT 373   Basic Metabolic Panel: Recent Labs  Lab 11/26/23 1338  NA 138  K 4.3  CL 103  CO2 26  GLUCOSE 87  BUN 10  CREATININE 0.76  CALCIUM 9.4   GFR: Estimated Creatinine Clearance: 108.6 mL/min (by C-G formula based on SCr of 0.76 mg/dL). Liver Function Tests: No results for input(s): AST, ALT, ALKPHOS, BILITOT, PROT, ALBUMIN in the last 168 hours. No results for input(s): LIPASE, AMYLASE in the last 168 hours. No results for input(s): AMMONIA in the last 168 hours.  Coagulation Profile: No results for input(s): INR, PROTIME in the last 168 hours. Cardiac Enzymes: No results for input(s): CKTOTAL, CKMB, CKMBINDEX, TROPONINI, TROPONINIHS in the last 168 hours. BNP (last 3 results) No results for input(s): BNP in the last 8760 hours. HbA1C: No results for input(s): HGBA1C in the last 72 hours. CBG: No results for input(s): GLUCAP in the last 168 hours. Lipid Profile: No results for input(s): CHOL, HDL, LDLCALC, TRIG, CHOLHDL, LDLDIRECT in the last 72 hours. Thyroid Function Tests: No results for input(s): TSH, T4TOTAL, FREET4, T3FREE, THYROIDAB in the last 72 hours. Anemia Panel: No results for input(s):  VITAMINB12, FOLATE, FERRITIN, TIBC, IRON, RETICCTPCT in the last 72 hours. Urine analysis:    Component Value Date/Time   COLORURINE YELLOW (A) 10/31/2023 2111   APPEARANCEUR CLEAR (A) 10/31/2023 2111   LABSPEC 1.023 10/31/2023 2111   PHURINE 6.0 10/31/2023 2111   GLUCOSEU NEGATIVE 10/31/2023 2111   HGBUR SMALL (A) 10/31/2023 2111   BILIRUBINUR NEGATIVE 10/31/2023 2111   KETONESUR 5 (A) 10/31/2023 2111   PROTEINUR NEGATIVE 10/31/2023 2111   NITRITE NEGATIVE 10/31/2023 2111   LEUKOCYTESUR NEGATIVE 10/31/2023 2111    Radiological Exams on Admission: I have personally reviewed images CT Angio Chest PE W and/or Wo Contrast Result Date: 11/26/2023 EXAM: CTA CHEST AORTA 11/26/2023 03:22:09 PM TECHNIQUE: CTA of the chest was performed without and with the administration of 75 mL of iohexol  (OMNIPAQUE ) 350 MG/ML injection. Multiplanar reformatted images are provided for review. MIP images are provided for review. Automated exposure control, iterative reconstruction, and/or weight based adjustment of the mA/kV was utilized to reduce the radiation dose to as low as reasonably achievable. COMPARISON: Chest x-ray 11/26/2023, chest CT 02/10/2023. CLINICAL HISTORY: Pulmonary embolism (PE) suspected, low to intermediate prob, positive D-dimer. FINDINGS: AORTA: Nonaneurysmal aorta. No thoracic aortic dissection. MEDIASTINUM: No mediastinal lymphadenopathy. The heart and pericardium demonstrate no acute abnormality. LYMPH NODES: No mediastinal, hilar or axillary lymphadenopathy. LUNGS AND PLEURA: Suboptimal opacification of subsegmental pulmonary vessels. No definite acute embolus is seen. The lungs are without acute process. No focal consolidation or pulmonary edema. No pleural effusion or pneumothorax. UPPER ABDOMEN: Hepatic steatosis. SOFT TISSUES AND BONES: No acute bone or soft tissue abnormality. IMPRESSION: 1. No definite acute pulmonary embolus identified; assessment of subsegmental vessels is  limited by suboptimal opacification. 2. Clear lung fields Electronically signed by: Luke Bun MD 11/26/2023 04:31 PM EST RP Workstation: [id number]   DG Chest Port 1 View Result Date: 11/26/2023 EXAM: 1 VIEW(S) XRAY OF THE CHEST 11/26/2023 01:43:00 PM COMPARISON: 02/10/2023 CLINICAL HISTORY: Chest pain FINDINGS: LUNGS AND PLEURA: No focal pulmonary opacity. No pleural effusion. No pneumothorax. HEART AND MEDIASTINUM: No acute abnormality of the cardiac and mediastinal silhouettes. BONES AND SOFT TISSUES: No acute osseous abnormality. IMPRESSION: 1. No acute cardiopulmonary process. Electronically signed by: Ryan Salvage MD 11/26/2023 03:07 PM EST RP Workstation: [id number]    EKG: My personal interpretation of EKG shows: Normal sinus rhythm without any acute ST changes    Assessment/Plan Active Problems:   * No active hospital problems. *     VTE prophylaxis:  SQ Heparin   Diet: Regular Code Status:  Full Code Telemetry:  Admission status: Observation, Telemetry bed Patient is from: Home Anticipated d/c is to: Home Anticipated d/c is in: 1-2 days   Family Communication: Updated at bedside  Consults called: Cardiology Dr. Dewane   Severity of Illness: The appropriate patient status for this patient is OBSERVATION. Observation status is judged to be reasonable and necessary in order  to provide the required intensity of service to ensure the patient's safety. The patient's presenting symptoms, physical exam findings, and initial radiographic and laboratory data in the context of their medical condition is felt to place them at decreased risk for further clinical deterioration. Furthermore, it is anticipated that the patient will be medically stable for discharge from the hospital within 2 midnights of admission.    Morene Bathe, MD [facility] DEL. [facility]

## 2023-11-26 NOTE — ED Triage Notes (Signed)
 Patient coming [facility] from home for chest pain after awakening at 7am this morning with increasing severity throughout the day. Pain radiates to jaw and left arm. Patient given 324 of aspirin and 2 SL nitros in transport with some relief of pain.

## 2023-11-27 ENCOUNTER — Observation Stay: Admit: 2023-11-27 | Discharge: 2023-11-27 | Disposition: A | Attending: Student

## 2023-11-27 DIAGNOSIS — G8929 Other chronic pain: Secondary | ICD-10-CM | POA: Insufficient documentation

## 2023-11-27 DIAGNOSIS — R0789 Other chest pain: Secondary | ICD-10-CM | POA: Diagnosis not present

## 2023-11-27 DIAGNOSIS — F329 Major depressive disorder, single episode, unspecified: Secondary | ICD-10-CM | POA: Insufficient documentation

## 2023-11-27 DIAGNOSIS — F411 Generalized anxiety disorder: Secondary | ICD-10-CM | POA: Insufficient documentation

## 2023-11-27 DIAGNOSIS — L680 Hirsutism: Secondary | ICD-10-CM | POA: Insufficient documentation

## 2023-11-27 LAB — CBC
HCT: 37.6 % (ref 36.0–46.0)
Hemoglobin: 12.5 g/dL (ref 12.0–15.0)
MCH: 30.9 pg (ref 26.0–34.0)
MCHC: 33.2 g/dL (ref 30.0–36.0)
MCV: 92.8 fL (ref 80.0–100.0)
Platelets: 333 K/uL (ref 150–400)
RBC: 4.05 MIL/uL (ref 3.87–5.11)
RDW: 13.8 % (ref 11.5–15.5)
WBC: 10.8 K/uL — ABNORMAL HIGH (ref 4.0–10.5)
nRBC: 0 % (ref 0.0–0.2)

## 2023-11-27 LAB — CBG MONITORING, ED: Glucose-Capillary: 116 mg/dL — ABNORMAL HIGH (ref 70–99)

## 2023-11-27 LAB — BASIC METABOLIC PANEL WITH GFR
Anion gap: 9 (ref 5–15)
BUN: 11 mg/dL (ref 6–20)
CO2: 26 mmol/L (ref 22–32)
Calcium: 8.8 mg/dL — ABNORMAL LOW (ref 8.9–10.3)
Chloride: 102 mmol/L (ref 98–111)
Creatinine, Ser: 0.84 mg/dL (ref 0.44–1.00)
GFR, Estimated: >60 mL/min (ref >60)
Glucose, Bld: 98 mg/dL (ref 70–99)
Potassium: 3.8 mmol/L (ref 3.5–5.1)
Sodium: 137 mmol/L (ref 135–145)

## 2023-11-27 LAB — ECHOCARDIOGRAM COMPLETE
AR max vel: 3.19 cm2
AV Area VTI: 3.02 cm2
AV Area mean vel: 2.92 cm2
AV Mean grad: 3 mmHg
AV Peak grad: 5.7 mmHg
Ao pk vel: 1.19 m/s
Area-P 1/2: 3.72 cm2
Calc EF: 58.3 %
Height: 68 in
MV VTI: 3.57 cm2
S' Lateral: 3.6 cm
Single Plane A2C EF: 60.1 %
Single Plane A4C EF: 55.6 %
Weight: 3756.64 [oz_av]

## 2023-11-27 LAB — HIV ANTIBODY (ROUTINE TESTING W REFLEX): HIV Screen 4th Generation wRfx: NONREACTIVE

## 2023-11-27 MED ORDER — SPIRONOLACTONE 25 MG PO TABS
50.0000 mg | ORAL_TABLET | Freq: Every day | ORAL | Status: DC
  Administered 2023-11-27: 50 mg via ORAL
  Filled 2023-11-27: qty 2

## 2023-11-27 MED ORDER — GABAPENTIN 100 MG PO CAPS
100.0000 mg | ORAL_CAPSULE | Freq: Three times a day (TID) | ORAL | Status: DC
  Administered 2023-11-27: 300 mg via ORAL
  Filled 2023-11-27: qty 3

## 2023-11-27 MED ORDER — DULOXETINE HCL 60 MG PO CPEP
60.0000 mg | ORAL_CAPSULE | Freq: Every day | ORAL | Status: DC
  Administered 2023-11-27: 60 mg via ORAL
  Filled 2023-11-27: qty 1

## 2023-11-27 MED ORDER — NICOTINE 14 MG/24HR TD PT24: 14.0000 mg | MEDICATED_PATCH | TRANSDERMAL | 0 refills | Status: AC

## 2023-11-27 MED ORDER — PANTOPRAZOLE SODIUM 40 MG PO TBEC
40.0000 mg | DELAYED_RELEASE_TABLET | Freq: Every day | ORAL | Status: DC
  Administered 2023-11-27: 40 mg via ORAL
  Filled 2023-11-27: qty 1

## 2023-11-27 NOTE — Consult Note (Signed)
 [facility] CLINIC CARDIOLOGY CONSULT NOTE       Patient ID: Marissa Rocha MRN: [id number] DOB/AGE: 07/07/1975 48 y.o.  Admit date: 11/26/2023 Referring Physician Dr. Morene Bathe Primary Physician Trenda Core, MD  Primary Cardiologist None Reason for Consultation Atypical chest pain  HPI: Marissa Rocha is a 48 y.o. female  with a past medical history of OSA, tobacco use disorder, generalized anxiety disorder  who presented to the ED on 11/26/2023 for chest pain. Cardiology was consulted for further evaluation.   Patient reports she had an episode of CP yesterday which lasted for a while. Given this she decided to come in for evaluation. Workup in the ED notable for creatinine 0.76, potassium 4.3, hemoglobin 13.6, WBC 11.7. Troponins <15 x2. EKG in the ED NSR rate 80 bpm, no acute ischemic changes. CTA chest without evidence of PE, limited study. CXR unremarkable.   At the time of my evaluation this AM, patient is resting comfortably in hospital bed. We discussed her symptoms in further detail. Endorses discomfort that started yesterday AM, initially felt like pressure then was more sharp and lasted a few hours. Was not exacerbated by activity. States that prior to yesterday she has not had any similar episodes or any exertional symptoms. Yesterday she also felt confused and dizzy. Denies any syncope. Reports a hx of chronic pelvic pain and has recently been told she may have a stomach ulcer.  Review of systems complete and found to be negative unless listed above    Past Medical History:  Diagnosis Date   Cystitis    Endometriosis    PCOS (polycystic ovarian syndrome)     Past Surgical History:  Procedure Laterality Date   CESAREAN SECTION     x3    (Not in a hospital admission)  Social History   Socioeconomic History   Marital status: Single    Spouse name: Not on file   Number of children: Not on file   Years of education: Not on file   Highest education level: Not on file   Occupational History   Not on file  Tobacco Use   Smoking status: Some Days    Current packs/day: 0.25    Types: Cigarettes   Smokeless tobacco: Never  Substance and Sexual Activity   Alcohol use: Yes    Comment: occ   Drug use: Never   Sexual activity: Yes  Other Topics Concern   Not on file  Social History Narrative   Not on file   Social Drivers of Health   Financial Resource Strain: High Risk (08/26/2023)   Received from [facility]   Overall Financial Resource Strain (CARDIA)    How hard is it for you to pay for the very basics like food, housing, medical care, and heating?: Very hard  Food Insecurity: Food Insecurity Present (08/26/2023)   Received from [facility]   Hunger Vital Sign    Within the past 12 months, you worried that your food would run out before you got the money to buy more.: Often true    Within the past 12 months, the food you bought just didn't last and you didn't have money to get more.: Often true  Transportation Needs: No Transportation Needs (08/26/2023)   Received from [facility] - Transportation    Lack of Transportation (Medical): No    Lack of Transportation (Non-Medical): No  Physical Activity: Not on file  Stress: Not on file  Social Connections: Not on file  Intimate Partner Violence: Not At Risk (07/24/2023)   Received from [facility]   Humiliation, Afraid, Rape, and Kick questionnaire    Within the last year, have you been afraid of your partner or ex-partner?: No    Within the last year, have you been humiliated or emotionally abused in other ways by your partner or ex-partner?: No    Within the last year, have you been kicked, hit, slapped, or otherwise physically hurt by your partner or ex-partner?: No    Within the last year, have you been raped or forced to have any kind of sexual activity by your partner or ex-partner?: No    History reviewed. No pertinent family history.   Vitals:   11/27/23 0558  11/27/23 0730 11/27/23 0800 11/27/23 0808  BP: 100/62 (!) 96/49 101/71   Pulse: 65 72 75   Resp: 16 (!) 7 13   Temp: 98.2 F (36.8 C)   97.9 F (36.6 C)  TempSrc: Oral   Oral  SpO2: 97% 97% 97%   Weight:      Height:        PHYSICAL EXAM General: Well appearing female, well nourished, in no acute distress. HEENT: Normocephalic and atraumatic. Neck: No JVD.  Lungs: Normal respiratory effort on room air. Clear bilaterally to auscultation. No wheezes, crackles, rhonchi.  Heart: HRRR. Normal S1 and S2 without gallops or murmurs.  Abdomen: Non-distended appearing.  Msk: Normal strength and tone for age. Extremities: Warm and well perfused. No clubbing, cyanosis. No edema.  Neuro: Alert and oriented X 3. Psych: Answers questions appropriately.   Labs: Basic Metabolic Panel: Recent Labs    11/26/23 1338 11/26/23 1650 11/27/23 0311  NA 138  --  137  K 4.3  --  3.8  CL 103  --  102  CO2 26  --  26  GLUCOSE 87  --  98  BUN 10  --  11  CREATININE 0.76  --  0.84  CALCIUM 9.4  --  8.8*  MG  --  1.7  --    Liver Function Tests: No results for input(s): AST, ALT, ALKPHOS, BILITOT, PROT, ALBUMIN in the last 72 hours. No results for input(s): LIPASE, AMYLASE in the last 72 hours. CBC: Recent Labs    11/26/23 1338 11/27/23 0311  WBC 11.7* 10.8*  HGB 13.6 12.5  HCT 39.9 37.6  MCV 91.9 92.8  PLT 373 333   Cardiac Enzymes: No results for input(s): CKTOTAL, CKMB, CKMBINDEX, TROPONINIHS in the last 72 hours. BNP: No results for input(s): BNP in the last 72 hours. D-Dimer: Recent Labs    11/26/23 1338  DDIMER 1.21*   Hemoglobin A1C: No results for input(s): HGBA1C in the last 72 hours. Fasting Lipid Panel: No results for input(s): CHOL, HDL, LDLCALC, TRIG, CHOLHDL, LDLDIRECT in the last 72 hours. Thyroid Function Tests: No results for input(s): TSH, T4TOTAL, T3FREE, THYROIDAB in the last 72 hours.  Invalid input(s):  FREET3 Anemia Panel: No results for input(s): VITAMINB12, FOLATE, FERRITIN, TIBC, IRON, RETICCTPCT in the last 72 hours.   Radiology: ECHOCARDIOGRAM COMPLETE Result Date: 11/27/2023    ECHOCARDIOGRAM REPORT   Patient Name:   Marissa Rocha Date of Exam: 11/27/2023 Medical Rec #:  [id number]   Height:       68.0 in Accession #:    [id number]  Weight:       234.8 lb Date of Birth:  10/31/1975    BSA:          2.188  m Patient Age:    48 years    BP:           100/62 mmHg Patient Gender: F           HR:           60 bpm. Exam Location:  [facility] Procedure: 2D Echo, Cardiac Doppler and Color Doppler (Both Spectral and Color            Flow Doppler were utilized during procedure). Indications:     Chest Pain R07.9  History:         Patient has no prior history of Echocardiogram examinations.                  Signs/Symptoms:Chest Pain.  Sonographer:     Ashley McNeely-Sloane Referring Phys:  [id number] Yordy Matton Diagnosing Phys: Sabina Custovic IMPRESSIONS  1. Left ventricular ejection fraction, by estimation, is 60 to 65%. The left ventricle has normal function. The left ventricle has no regional wall motion abnormalities. Left ventricular diastolic parameters were normal.  2. Right ventricular systolic function is normal. The right ventricular size is normal.  3. The mitral valve is normal in structure. No evidence of mitral valve regurgitation. No evidence of mitral stenosis.  4. The aortic valve is normal in structure. Aortic valve regurgitation is not visualized. No aortic stenosis is present.  5. The inferior vena cava is normal in size with greater than 50% respiratory variability, suggesting right atrial pressure of 3 mmHg. FINDINGS  Left Ventricle: Left ventricular ejection fraction, by estimation, is 60 to 65%. The left ventricle has normal function. The left ventricle has no regional wall motion abnormalities. The left ventricular internal cavity size was normal in size. There is  no left  ventricular hypertrophy. Left ventricular diastolic parameters were normal. Right Ventricle: The right ventricular size is normal. No increase in right ventricular wall thickness. Right ventricular systolic function is normal. Left Atrium: Left atrial size was normal in size. Right Atrium: Right atrial size was normal in size. Pericardium: There is no evidence of pericardial effusion. Mitral Valve: The mitral valve is normal in structure. No evidence of mitral valve regurgitation. No evidence of mitral valve stenosis. MV peak gradient, 2.2 mmHg. The mean mitral valve gradient is 1.0 mmHg. Tricuspid Valve: The tricuspid valve is normal in structure. Tricuspid valve regurgitation is trivial. Aortic Valve: The aortic valve is normal in structure. Aortic valve regurgitation is not visualized. No aortic stenosis is present. Aortic valve mean gradient measures 3.0 mmHg. Aortic valve peak gradient measures 5.7 mmHg. Aortic valve area, by VTI measures 3.02 cm. Pulmonic Valve: The pulmonic valve was normal in structure. Pulmonic valve regurgitation is not visualized. Aorta: The aortic root is normal in size and structure. Venous: The inferior vena cava is normal in size with greater than 50% respiratory variability, suggesting right atrial pressure of 3 mmHg. IAS/Shunts: No atrial level shunt detected by color flow Doppler.  LEFT VENTRICLE PLAX 2D LVIDd:         4.80 cm     Diastology LVIDs:         3.60 cm     LV e' medial:    8.70 cm/s LV PW:         1.00 cm     LV E/e' medial:  7.5 LV IVS:        0.80 cm     LV e' lateral:   12.40 cm/s LVOT diam:     2.00 cm  LV E/e' lateral: 5.2 LV SV:         76 LV SV Index:   35 LVOT Area:     3.14 cm LV IVRT:       85 msec  LV Volumes (MOD) LV vol d, MOD A2C: 59.6 ml LV vol d, MOD A4C: 69.2 ml LV vol s, MOD A2C: 23.8 ml LV vol s, MOD A4C: 30.7 ml LV SV MOD A2C:     35.8 ml LV SV MOD A4C:     69.2 ml LV SV MOD BP:      38.9 ml RIGHT VENTRICLE RV Basal diam:  3.00 cm  PULMONARY  VEINS RV Mid diam:    2.00 cm  A Reversal Duration: 148.00 msec TAPSE (M-mode): 1.8 cm   A Reversal Velocity: 24.60 cm/s                          Diastolic Velocity:  32.80 cm/s                          S/D Velocity:        1.60                          Systolic Velocity:   52.70 cm/s LEFT ATRIUM             Index        RIGHT ATRIUM           Index LA diam:        3.20 cm 1.46 cm/m   RA Area:     12.00 cm LA Vol (A2C):   35.1 ml 16.04 ml/m  RA Volume:   24.00 ml  10.97 ml/m LA Vol (A4C):   27.3 ml 12.48 ml/m LA Biplane Vol: 31.7 ml 14.49 ml/m  AORTIC VALVE                    PULMONIC VALVE AV Area (Vmax):    3.19 cm     PV Vmax:        0.82 m/s AV Area (Vmean):   2.92 cm     PV Vmean:       58.500 cm/s AV Area (VTI):     3.02 cm     PV VTI:         0.197 m AV Vmax:           119.00 cm/s  PV Peak grad:   2.7 mmHg AV Vmean:          82.100 cm/s  PV Mean grad:   2.0 mmHg AV VTI:            0.251 m      RVOT Peak grad: 1 mmHg AV Peak Grad:      5.7 mmHg AV Mean Grad:      3.0 mmHg LVOT Vmax:         121.00 cm/s LVOT Vmean:        76.400 cm/s LVOT VTI:          0.241 m LVOT/AV VTI ratio: 0.96  AORTA Ao Root diam: 3.10 cm Ao Asc diam:  3.00 cm MITRAL VALVE MV Area (PHT): 3.72 cm    SHUNTS MV Area VTI:   3.57 cm    Systemic VTI:  0.24 m MV Peak grad:  2.2 mmHg    Systemic Diam: 2.00  cm MV Mean grad:  1.0 mmHg    Pulmonic VTI:  0.122 m MV Vmax:       0.74 m/s MV Vmean:      46.7 cm/s MV Decel Time: 204 msec MV E velocity: 65.00 cm/s MV A velocity: 65.50 cm/s MV E/A ratio:  0.99 Sabina Custovic Electronically signed by Annalee Casa Signature Date/Time: 11/27/2023/9:24:27 AM    Final    CT Angio Chest PE W and/or Wo Contrast Result Date: 11/26/2023 EXAM: CTA CHEST AORTA 11/26/2023 03:22:09 PM TECHNIQUE: CTA of the chest was performed without and with the administration of 75 mL of iohexol  (OMNIPAQUE ) 350 MG/ML injection. Multiplanar reformatted images are provided for review. MIP images are provided for  review. Automated exposure control, iterative reconstruction, and/or weight based adjustment of the mA/kV was utilized to reduce the radiation dose to as low as reasonably achievable. COMPARISON: Chest x-ray 11/26/2023, chest CT 02/10/2023. CLINICAL HISTORY: Pulmonary embolism (PE) suspected, low to intermediate prob, positive D-dimer. FINDINGS: AORTA: Nonaneurysmal aorta. No thoracic aortic dissection. MEDIASTINUM: No mediastinal lymphadenopathy. The heart and pericardium demonstrate no acute abnormality. LYMPH NODES: No mediastinal, hilar or axillary lymphadenopathy. LUNGS AND PLEURA: Suboptimal opacification of subsegmental pulmonary vessels. No definite acute embolus is seen. The lungs are without acute process. No focal consolidation or pulmonary edema. No pleural effusion or pneumothorax. UPPER ABDOMEN: Hepatic steatosis. SOFT TISSUES AND BONES: No acute bone or soft tissue abnormality. IMPRESSION: 1. No definite acute pulmonary embolus identified; assessment of subsegmental vessels is limited by suboptimal opacification. 2. Clear lung fields Electronically signed by: Luke Bun MD 11/26/2023 04:31 PM EST RP Workstation: [id number]   DG Chest Port 1 View Result Date: 11/26/2023 EXAM: 1 VIEW(S) XRAY OF THE CHEST 11/26/2023 01:43:00 PM COMPARISON: 02/10/2023 CLINICAL HISTORY: Chest pain FINDINGS: LUNGS AND PLEURA: No focal pulmonary opacity. No pleural effusion. No pneumothorax. HEART AND MEDIASTINUM: No acute abnormality of the cardiac and mediastinal silhouettes. BONES AND SOFT TISSUES: No acute osseous abnormality. IMPRESSION: 1. No acute cardiopulmonary process. Electronically signed by: Ryan Salvage MD 11/26/2023 03:07 PM EST RP Workstation: [id number]   US  PELVIC COMPLETE W TRANSVAGINAL AND TORSION R/O Result Date: 11/01/2023 EXAM: US  Pelvis, Complete Transvaginal and Transabdominal with Doppler 11/01/2023 02:03:00 AM TECHNIQUE: Transabdominal and transvaginal pelvic duplex ultrasound using  B-mode/gray scaled imaging with Doppler spectral analysis and color flow was obtained. COMPARISON: ct renal 10/08/23 CLINICAL HISTORY: 144615 Pain 144615 Pain FINDINGS: UTERUS: Uterus measures 7.9 x 4 x 4.7 cm, volume 78 ml. Uterus demonstrates normal myometrial echotexture. ENDOMETRIAL STRIPE: Endometrial measures 6 mm. T-shaped intrauterine device is within the endometrium in appropriate position. RIGHT OVARY: Right ovary measures 4.4 x 1.6 x 1.3 cm, volume 4.9 ml. Right ovary is within normal limits. There is normal arterial and venous Doppler flow. LEFT OVARY: Left ovary measures 3.8 x 1.5 x 1.6 cm, volume 4.7 ml. Left ovary is within normal limits. There is normal arterial and venous Doppler flow. FREE FLUID: No free fluid. IMPRESSION: 1. No acute abnormality. 2. Intrauterine device in expected position. Electronically signed by: Morgane Naveau MD 11/01/2023 02:13 AM EDT RP Workstation: [id number]    ECHO as above  TELEMETRY (personally reviewed): sinus rhythm rate 60-70s  EKG (personally reviewed): NSR rate 80 bpm, no acute ischemic changes  Data reviewed by me 11/27/2023: last 24h vitals tele labs imaging I/O ED provider note, admission H&P  Principal Problem:   Atypical chest pain Active Problems:   Chronic pelvic pain in female   Hirsutism   MDD (major depressive  disorder)   GAD (generalized anxiety disorder)    ASSESSMENT AND PLAN:  Andrian Urbach is a 48 y.o. female  with a past medical history of OSA, tobacco use disorder, generalized anxiety disorder  who presented to the ED on 11/26/2023 for chest pain. Cardiology was consulted for further evaluation.   # Atypical chest pain # Obstructive sleep apnea Patient presented after episode of atypical chest pain yesterday while at home. Has been waxing and waning. Troponins normal x2. EKG without acute ischemic changes. Echo this admission with EF 60-65%, no WMAs, no valvular abnormalities.  -Low suspicion for ACS given atypical symptoms,  normal trops, normal EKG, and normal echo.  -Continue home spironolactone  50 mg daily.   Patient stable for discharge from cardiac perspective. Will set up outpatient follow up and nuclear stress test in our office. Patient given dates/times of appointments but may need to reschedule due to her job. She has phone number for office to call and reschedule for date/time that works with her schedule.  This patient's plan of care was discussed and created with Dr. Custovic and he is in agreement.  Signed: Danita Bloch, PA-C  11/27/2023, 9:38 AM [facility]

## 2023-11-27 NOTE — Discharge Summary (Signed)
 Physician Discharge Summary   Patient: Marissa Rocha MRN: [id number]  DOB: 10/17/1975   Admit:     Date of Admission: 11/26/2023 Admitted from: home   Discharge: Date of discharge: 11/27/23 Disposition: Home Condition at discharge: good  CODE STATUS: FULL CODE     Discharge Physician: Laneta Blunt, DO [facility]     PCP: Trenda Core, MD  Recommendations for Outpatient Follow-up:  Follow up with PCP Trenda Core, MD in 2-4 weeks Follow as directed w/ cardiology     Discharge Instructions     Diet - low sodium heart healthy   Complete by: As directed    Increase activity slowly   Complete by: As directed          Discharge Diagnoses: Principal Problem:   Atypical chest pain Active Problems:   Chronic pelvic pain in female   Hirsutism   MDD (major depressive disorder)   GAD (generalized anxiety disorder)       Hospital course / significant events:  Marissa Rocha is a 48 y.o. female  with a past medical history of OSA, tobacco use disorder, generalized anxiety disorder  who presented to the ED on 11/26/2023 for chest pain. Endorses discomfort that started yesterday AM, initially felt like pressure then was more sharp and lasted a few hours. Was not exacerbated by activity. States that prior to yesterday she has not had any similar episodes or any exertional symptoms. Yesterday she also felt confused and dizzy. Denies any syncope. Reports a hx of chronic pelvic pain and has recently been told she may have a stomach ulcer. Cardiology was consulted for further evaluation. ACS reliably excluded. No concerns on echo. Scheduled for outpateint stress test w/ cardiology     Consultants:  cardiology  Procedures/Surgeries: none      ASSESSMENT & PLAN:   Atypical chest pain - ruled out ACS Stress test outpatient Cardiology follow up  Obstructive sleep apnea Follow outpatient   Stable problems Pelvic pain - follow outpatient and  continue current meds GERD - continue PPI, bentyl  OSA - follow outpatient Anxiety / depression - continue cymbalta     Class 2 obesity based on BMI: Body mass index is 35.7 kg/m.SABRA Significantly low or high BMI is associated with higher medical risk.  Underweight - under 18  overweight - 25 to 29 obese - 30 or more Class 1 obesity: BMI of 30.0 to 34 Class 2 obesity: BMI of 35.0 to 39 Class 3 obesity: BMI of 40.0 to 49 Super Morbid Obesity: BMI 50-59 Super-super Morbid Obesity: BMI 60+ Healthy nutrition and physical activity advised as adjunct to other disease management and risk reduction treatments            Discharge Instructions  Allergies as of 11/27/2023       Reactions   Tramadol Other (See Comments)   headache        Medication List     STOP taking these medications    oxyCODONE -acetaminophen  5-325 MG tablet Commonly known as: PERCOCET/ROXICET   sertraline 50 MG tablet Commonly known as: ZOLOFT       TAKE these medications    diazepam 10 MG tablet Commonly known as: VALIUM 10 mg every 8 (eight) hours as needed. VAGINALLY   dicyclomine  20 MG tablet Commonly known as: BENTYL  Take 20 mg by mouth 4 (four) times daily as needed. What changed: Another medication with the same name was removed. Continue taking this medication, and follow the directions you see here.  DULoxetine  60 MG capsule Commonly known as: CYMBALTA  Take 60 mg by mouth daily.   gabapentin  300 MG capsule Commonly known as: NEURONTIN  Take 100-300 mg by mouth 3 (three) times daily.   Naltrex 4.5 MG Caps Generic drug: Naltrexone HCl (Pain) Take 1 capsule by mouth at bedtime.   nicotine  14 mg/24hr patch Commonly known as: NICODERM CQ  - dosed in mg/24 hours Place 1 patch (14 mg total) onto the skin daily.   omeprazole 40 MG capsule Commonly known as: PRILOSEC Take 40 mg by mouth daily.   ondansetron  4 MG disintegrating tablet Commonly known as: ZOFRAN -ODT Take 1  tablet (4 mg total) by mouth every 8 (eight) hours as needed for nausea or vomiting.   oxybutynin 10 MG 24 hr tablet Commonly known as: DITROPAN-XL Take 10 mg by mouth at bedtime.   spironolactone  50 MG tablet Commonly known as: ALDACTONE  Take 50 mg by mouth daily.   tamsulosin 0.4 MG Caps capsule Commonly known as: FLOMAX Take 0.4 mg by mouth daily.         Follow-up Information     Custovic, Annalee, DO. Go in 1 week(s).   Specialty: Cardiology Why: SCHEDULE IN [facility] Contact information: [address] [place] [address] [phone]                 Allergies  Allergen Reactions   Tramadol Other (See Comments)    headache     Subjective: pt reports chest discomfort is lesser today, no SOB, no other concerns   Discharge Exam: BP (!) 92/58   Pulse 65   Temp 97.9 F (36.6 C) (Oral)   Resp 13   Ht 5' 8 (1.727 m)   Wt 106.5 kg   SpO2 97%   BMI 35.70 kg/m  General: Pt is alert, awake, not in acute distress Cardiovascular: RRR, S1/S2 +, no rubs, no gallops Respiratory: CTA bilaterally, no wheezing, no rhonchi Abdominal: Soft, NT, ND, bowel sounds + Extremities: no edema, no cyanosis     The results of significant diagnostics from this hospitalization (including imaging, microbiology, ancillary and laboratory) are listed below for reference.     Microbiology: No results found for this or any previous visit (from the past 240 hours).   Labs: BNP (last 3 results) No results for input(s): BNP in the last 8760 hours. Basic Metabolic Panel: Recent Labs  Lab 11/26/23 1338 11/26/23 1650 11/27/23 0311  NA 138  --  137  K 4.3  --  3.8  CL 103  --  102  CO2 26  --  26  GLUCOSE 87  --  98  BUN 10  --  11  CREATININE 0.76  --  0.84  CALCIUM 9.4  --  8.8*  MG  --  1.7  --    Liver Function Tests: No results for input(s): AST, ALT, ALKPHOS, BILITOT, PROT, ALBUMIN in the last 168 hours. No results for input(s):  LIPASE, AMYLASE in the last 168 hours. No results for input(s): AMMONIA in the last 168 hours. CBC: Recent Labs  Lab 11/26/23 1338 11/27/23 0311  WBC 11.7* 10.8*  HGB 13.6 12.5  HCT 39.9 37.6  MCV 91.9 92.8  PLT 373 333   Cardiac Enzymes: No results for input(s): CKTOTAL, CKMB, CKMBINDEX, TROPONINI in the last 168 hours. BNP: Invalid input(s): POCBNP CBG: Recent Labs  Lab 11/27/23 0805  GLUCAP 116*   D-Dimer Recent Labs    11/26/23 1338  DDIMER 1.21*   Hgb A1c No results  for input(s): HGBA1C in the last 72 hours. Lipid Profile No results for input(s): CHOL, HDL, LDLCALC, TRIG, CHOLHDL, LDLDIRECT in the last 72 hours. Thyroid function studies No results for input(s): TSH, T4TOTAL, T3FREE, THYROIDAB in the last 72 hours.  Invalid input(s): FREET3 Anemia work up No results for input(s): VITAMINB12, FOLATE, FERRITIN, TIBC, IRON, RETICCTPCT in the last 72 hours. Urinalysis    Component Value Date/Time   COLORURINE YELLOW (A) 10/31/2023 2111   APPEARANCEUR CLEAR (A) 10/31/2023 2111   LABSPEC 1.023 10/31/2023 2111   PHURINE 6.0 10/31/2023 2111   GLUCOSEU NEGATIVE 10/31/2023 2111   HGBUR SMALL (A) 10/31/2023 2111   BILIRUBINUR NEGATIVE 10/31/2023 2111   KETONESUR 5 (A) 10/31/2023 2111   PROTEINUR NEGATIVE 10/31/2023 2111   NITRITE NEGATIVE 10/31/2023 2111   LEUKOCYTESUR NEGATIVE 10/31/2023 2111   Sepsis Labs Recent Labs  Lab 11/26/23 1338 11/27/23 0311  WBC 11.7* 10.8*   Microbiology No results found for this or any previous visit (from the past 240 hours). Imaging ECHOCARDIOGRAM COMPLETE Result Date: 11/27/2023    ECHOCARDIOGRAM REPORT   Patient Name:   MINHA FULCO Date of Exam: 11/27/2023 Medical Rec #:  [id number]   Height:       68.0 in Accession #:    [id number]  Weight:       234.8 lb Date of Birth:  December 08, 1975    BSA:          2.188 m Patient Age:    48 years    BP:           100/62 mmHg Patient Gender:  F           HR:           60 bpm. Exam Location:  [facility] Procedure: 2D Echo, Cardiac Doppler and Color Doppler (Both Spectral and Color            Flow Doppler were utilized during procedure). Indications:     Chest Pain R07.9  History:         Patient has no prior history of Echocardiogram examinations.                  Signs/Symptoms:Chest Pain.  Sonographer:     Ashley McNeely-Sloane Referring Phys:  [id number] CARALYN HUDSON Diagnosing Phys: Sabina Custovic IMPRESSIONS  1. Left ventricular ejection fraction, by estimation, is 60 to 65%. The left ventricle has normal function. The left ventricle has no regional wall motion abnormalities. Left ventricular diastolic parameters were normal.  2. Right ventricular systolic function is normal. The right ventricular size is normal.  3. The mitral valve is normal in structure. No evidence of mitral valve regurgitation. No evidence of mitral stenosis.  4. The aortic valve is normal in structure. Aortic valve regurgitation is not visualized. No aortic stenosis is present.  5. The inferior vena cava is normal in size with greater than 50% respiratory variability, suggesting right atrial pressure of 3 mmHg. FINDINGS  Left Ventricle: Left ventricular ejection fraction, by estimation, is 60 to 65%. The left ventricle has normal function. The left ventricle has no regional wall motion abnormalities. The left ventricular internal cavity size was normal in size. There is  no left ventricular hypertrophy. Left ventricular diastolic parameters were normal. Right Ventricle: The right ventricular size is normal. No increase in right ventricular wall thickness. Right ventricular systolic function is normal. Left Atrium: Left atrial size was normal in size. Right Atrium: Right atrial size was normal in size. Pericardium: There is no  evidence of pericardial effusion. Mitral Valve: The mitral valve is normal in structure. No evidence of mitral valve regurgitation. No evidence of mitral valve  stenosis. MV peak gradient, 2.2 mmHg. The mean mitral valve gradient is 1.0 mmHg. Tricuspid Valve: The tricuspid valve is normal in structure. Tricuspid valve regurgitation is trivial. Aortic Valve: The aortic valve is normal in structure. Aortic valve regurgitation is not visualized. No aortic stenosis is present. Aortic valve mean gradient measures 3.0 mmHg. Aortic valve peak gradient measures 5.7 mmHg. Aortic valve area, by VTI measures 3.02 cm. Pulmonic Valve: The pulmonic valve was normal in structure. Pulmonic valve regurgitation is not visualized. Aorta: The aortic root is normal in size and structure. Venous: The inferior vena cava is normal in size with greater than 50% respiratory variability, suggesting right atrial pressure of 3 mmHg. IAS/Shunts: No atrial level shunt detected by color flow Doppler.  LEFT VENTRICLE PLAX 2D LVIDd:         4.80 cm     Diastology LVIDs:         3.60 cm     LV e' medial:    8.70 cm/s LV PW:         1.00 cm     LV E/e' medial:  7.5 LV IVS:        0.80 cm     LV e' lateral:   12.40 cm/s LVOT diam:     2.00 cm     LV E/e' lateral: 5.2 LV SV:         76 LV SV Index:   35 LVOT Area:     3.14 cm LV IVRT:       85 msec  LV Volumes (MOD) LV vol d, MOD A2C: 59.6 ml LV vol d, MOD A4C: 69.2 ml LV vol s, MOD A2C: 23.8 ml LV vol s, MOD A4C: 30.7 ml LV SV MOD A2C:     35.8 ml LV SV MOD A4C:     69.2 ml LV SV MOD BP:      38.9 ml RIGHT VENTRICLE RV Basal diam:  3.00 cm  PULMONARY VEINS RV Mid diam:    2.00 cm  A Reversal Duration: 148.00 msec TAPSE (M-mode): 1.8 cm   A Reversal Velocity: 24.60 cm/s                          Diastolic Velocity:  32.80 cm/s                          S/D Velocity:        1.60                          Systolic Velocity:   52.70 cm/s LEFT ATRIUM             Index        RIGHT ATRIUM           Index LA diam:        3.20 cm 1.46 cm/m   RA Area:     12.00 cm LA Vol (A2C):   35.1 ml 16.04 ml/m  RA Volume:   24.00 ml  10.97 ml/m LA Vol (A4C):   27.3 ml 12.48  ml/m LA Biplane Vol: 31.7 ml 14.49 ml/m  AORTIC VALVE  PULMONIC VALVE AV Area (Vmax):    3.19 cm     PV Vmax:        0.82 m/s AV Area (Vmean):   2.92 cm     PV Vmean:       58.500 cm/s AV Area (VTI):     3.02 cm     PV VTI:         0.197 m AV Vmax:           119.00 cm/s  PV Peak grad:   2.7 mmHg AV Vmean:          82.100 cm/s  PV Mean grad:   2.0 mmHg AV VTI:            0.251 m      RVOT Peak grad: 1 mmHg AV Peak Grad:      5.7 mmHg AV Mean Grad:      3.0 mmHg LVOT Vmax:         121.00 cm/s LVOT Vmean:        76.400 cm/s LVOT VTI:          0.241 m LVOT/AV VTI ratio: 0.96  AORTA Ao Root diam: 3.10 cm Ao Asc diam:  3.00 cm MITRAL VALVE MV Area (PHT): 3.72 cm    SHUNTS MV Area VTI:   3.57 cm    Systemic VTI:  0.24 m MV Peak grad:  2.2 mmHg    Systemic Diam: 2.00 cm MV Mean grad:  1.0 mmHg    Pulmonic VTI:  0.122 m MV Vmax:       0.74 m/s MV Vmean:      46.7 cm/s MV Decel Time: 204 msec MV E velocity: 65.00 cm/s MV A velocity: 65.50 cm/s MV E/A ratio:  0.99 Sabina Custovic Electronically signed by Annalee Casa Signature Date/Time: 11/27/2023/9:24:27 AM    Final       Time coordinating discharge: over 30 minutes  SIGNED:  Laneta Blunt DO [facility]

## 2024-02-03 IMAGING — CR DG CHEST 2V
2 series · 2 of 2 positions shown · non-contrast
Comparison: 04/27/2020

CLINICAL DATA: Intermittent chest pain for 1 day

EXAM:
CHEST - 2 VIEW

[chest pa]
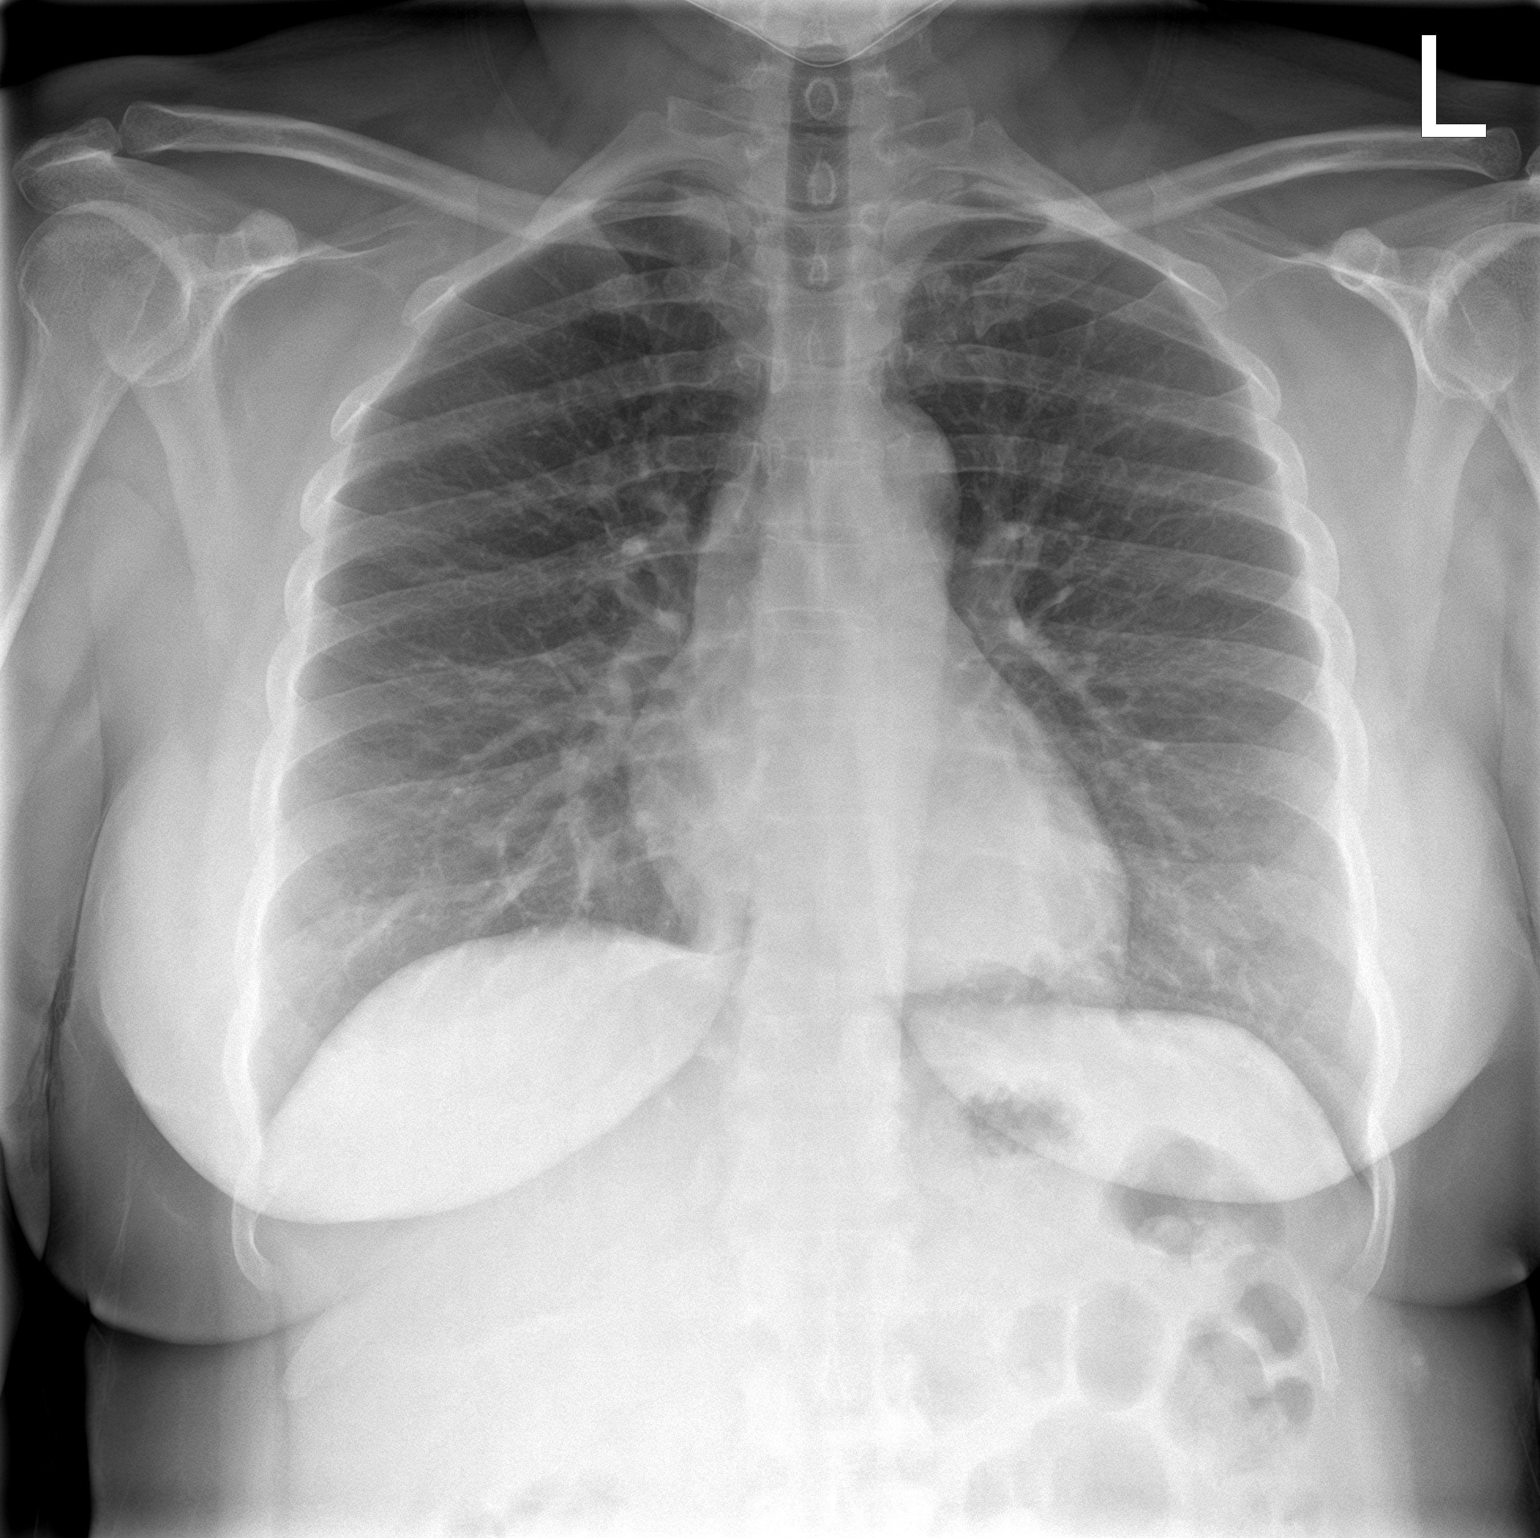

[chest lat]
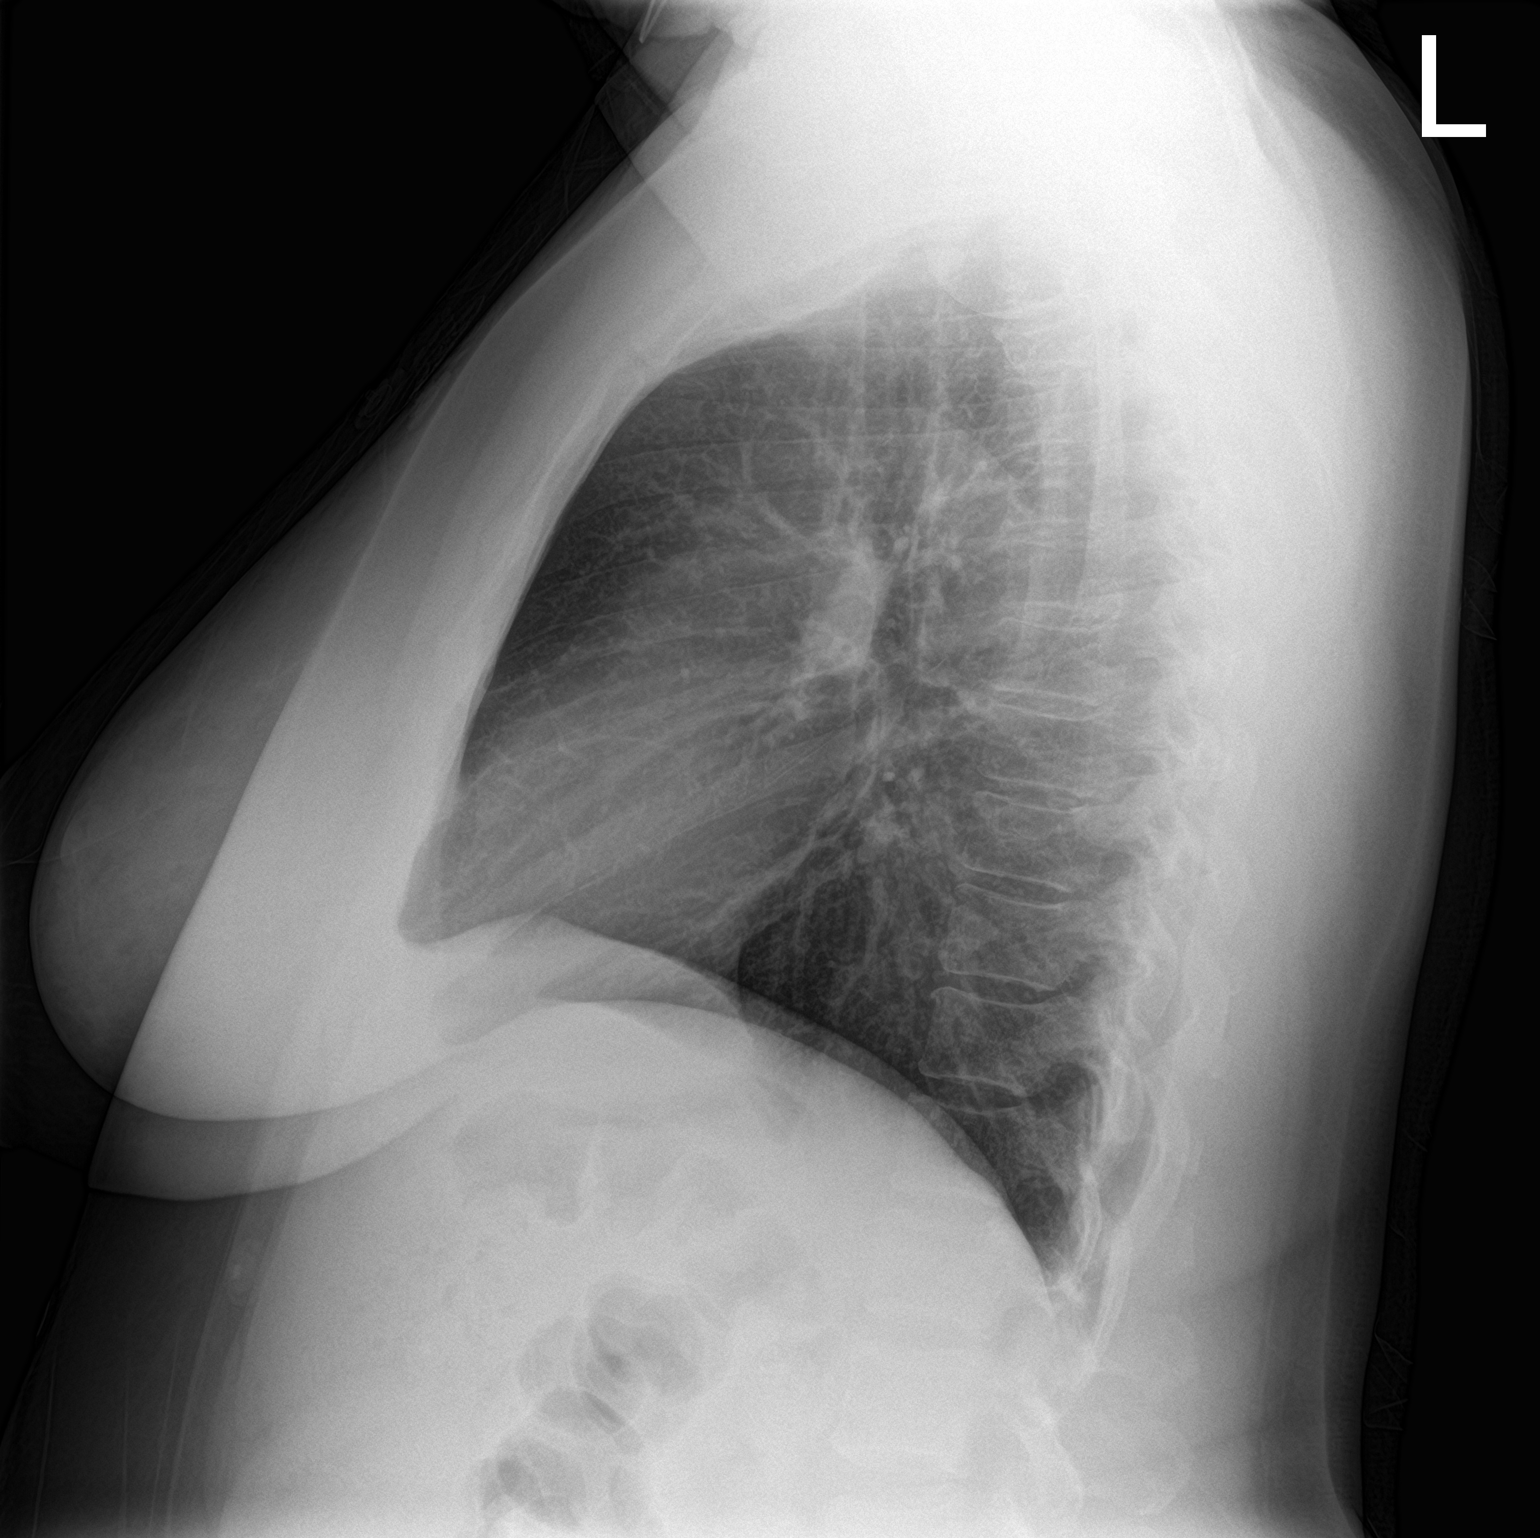

[2 of 2 positions shown; findings below may reference images not displayed]

FINDINGS: The heart size and mediastinal contours are within normal limits.
Both lungs are clear. The visualized skeletal structures are
unremarkable.
IMPRESSION: No active cardiopulmonary disease.
